# Patient Record
Sex: Male | Born: 1975
Health system: Southern US, Community
[De-identification: ages and names within clinical notes are randomized; demographics above are authoritative.]

## PROBLEM LIST (undated history)

## (undated) DIAGNOSIS — K3184 Gastroparesis: Secondary | ICD-10-CM

## (undated) DIAGNOSIS — R945 Abnormal results of liver function studies: Secondary | ICD-10-CM

## (undated) DIAGNOSIS — E119 Type 2 diabetes mellitus without complications: Secondary | ICD-10-CM

## (undated) DIAGNOSIS — I10 Essential (primary) hypertension: Secondary | ICD-10-CM

## (undated) DIAGNOSIS — K2970 Gastritis, unspecified, without bleeding: Secondary | ICD-10-CM

## (undated) DIAGNOSIS — S43131A Dislocation of right acromioclavicular joint, greater than 200% displacement, initial encounter: Secondary | ICD-10-CM

## (undated) DIAGNOSIS — G039 Meningitis, unspecified: Secondary | ICD-10-CM

## (undated) DIAGNOSIS — R3589 Other polyuria: Secondary | ICD-10-CM

## (undated) DIAGNOSIS — R7989 Other specified abnormal findings of blood chemistry: Secondary | ICD-10-CM

## (undated) DIAGNOSIS — K219 Gastro-esophageal reflux disease without esophagitis: Secondary | ICD-10-CM

## (undated) DIAGNOSIS — R358 Other polyuria: Secondary | ICD-10-CM

## (undated) DIAGNOSIS — K2940 Chronic atrophic gastritis without bleeding: Secondary | ICD-10-CM

## (undated) HISTORY — DX: Chronic atrophic gastritis without bleeding: K29.40

## (undated) HISTORY — DX: Gastro-esophageal reflux disease without esophagitis: K21.9

## (undated) HISTORY — DX: Abnormal results of liver function studies: R94.5

## (undated) HISTORY — PX: WISDOM TOOTH EXTRACTION: SHX21

## (undated) HISTORY — DX: Gastritis, unspecified, without bleeding: K29.70

## (undated) HISTORY — DX: Gastroparesis: K31.84

## (undated) HISTORY — DX: Other polyuria: R35.89

## (undated) HISTORY — PX: HERNIA REPAIR: SHX51

## (undated) HISTORY — DX: Meningitis, unspecified: G03.9

## (undated) HISTORY — DX: Other specified abnormal findings of blood chemistry: R79.89

## (undated) HISTORY — DX: Other polyuria: R35.8

---

## 1997-12-28 ENCOUNTER — Emergency Department (HOSPITAL_COMMUNITY): Admission: EM | Admit: 1997-12-28 | Discharge: 1997-12-28 | Payer: Self-pay | Admitting: *Deleted

## 2001-07-18 ENCOUNTER — Encounter: Payer: Self-pay | Admitting: Emergency Medicine

## 2001-07-18 ENCOUNTER — Emergency Department (HOSPITAL_COMMUNITY): Admission: EM | Admit: 2001-07-18 | Discharge: 2001-07-18 | Payer: Self-pay | Admitting: Emergency Medicine

## 2002-02-26 ENCOUNTER — Emergency Department (HOSPITAL_COMMUNITY): Admission: EM | Admit: 2002-02-26 | Discharge: 2002-02-27 | Payer: Self-pay | Admitting: Emergency Medicine

## 2007-11-27 ENCOUNTER — Ambulatory Visit: Payer: Self-pay | Admitting: Pulmonary Disease

## 2007-12-02 ENCOUNTER — Emergency Department (HOSPITAL_COMMUNITY): Admission: EM | Admit: 2007-12-02 | Discharge: 2007-12-02 | Payer: Self-pay | Admitting: Emergency Medicine

## 2007-12-14 ENCOUNTER — Encounter: Payer: Self-pay | Admitting: Pulmonary Disease

## 2008-10-30 ENCOUNTER — Telehealth: Payer: Self-pay | Admitting: Gastroenterology

## 2008-11-04 ENCOUNTER — Ambulatory Visit: Payer: Self-pay | Admitting: Gastroenterology

## 2008-11-04 DIAGNOSIS — R112 Nausea with vomiting, unspecified: Secondary | ICD-10-CM

## 2008-11-04 DIAGNOSIS — Z87898 Personal history of other specified conditions: Secondary | ICD-10-CM

## 2008-11-04 DIAGNOSIS — G039 Meningitis, unspecified: Secondary | ICD-10-CM | POA: Insufficient documentation

## 2008-11-05 ENCOUNTER — Ambulatory Visit: Payer: Self-pay | Admitting: Gastroenterology

## 2008-11-05 ENCOUNTER — Encounter: Payer: Self-pay | Admitting: Gastroenterology

## 2008-11-05 DIAGNOSIS — K2940 Chronic atrophic gastritis without bleeding: Secondary | ICD-10-CM | POA: Insufficient documentation

## 2008-11-05 LAB — CONVERTED CEMR LAB
ALT: 58 units/L — ABNORMAL HIGH (ref 0–53)
AST: 34 units/L (ref 0–37)
Albumin: 4.5 g/dL (ref 3.5–5.2)
Alkaline Phosphatase: 65 units/L (ref 39–117)
BUN: 11 mg/dL (ref 6–23)
Basophils Absolute: 0 10*3/uL (ref 0.0–0.1)
Basophils Relative: 0.5 % (ref 0.0–3.0)
Bilirubin Urine: NEGATIVE
Bilirubin, Direct: 0.1 mg/dL (ref 0.0–0.3)
CO2: 32 meq/L (ref 19–32)
Calcium: 9.8 mg/dL (ref 8.4–10.5)
Chloride: 105 meq/L (ref 96–112)
Creatinine, Ser: 0.9 mg/dL (ref 0.4–1.5)
Eosinophils Absolute: 0.1 10*3/uL (ref 0.0–0.7)
Eosinophils Relative: 1.6 % (ref 0.0–5.0)
Ferritin: 165.7 ng/mL (ref 22.0–322.0)
Folate: 12.4 ng/mL
GFR calc non Af Amer: 103.62 mL/min (ref >60)
Glucose, Bld: 84 mg/dL (ref 70–99)
HCT: 47.2 % (ref 39.0–52.0)
Hemoglobin, Urine: NEGATIVE
Hemoglobin: 16.8 g/dL (ref 13.0–17.0)
Iron: 144 ug/dL (ref 42–165)
Ketones, ur: NEGATIVE mg/dL
Leukocytes, UA: NEGATIVE
Lymphocytes Relative: 26.9 % (ref 12.0–46.0)
Lymphs Abs: 1.6 10*3/uL (ref 0.7–4.0)
MCHC: 35.5 g/dL (ref 30.0–36.0)
MCV: 90.2 fL (ref 78.0–100.0)
Monocytes Absolute: 0.4 10*3/uL (ref 0.1–1.0)
Monocytes Relative: 6.4 % (ref 3.0–12.0)
Neutro Abs: 3.7 10*3/uL (ref 1.4–7.7)
Neutrophils Relative %: 64.6 % (ref 43.0–77.0)
Nitrite: NEGATIVE
Platelets: 208 10*3/uL (ref 150.0–400.0)
Potassium: 4.3 meq/L (ref 3.5–5.1)
RBC: 5.23 M/uL (ref 4.22–5.81)
RDW: 11.3 % — ABNORMAL LOW (ref 11.5–14.6)
Rheumatoid fact SerPl-aCnc: 20.6 intl units/mL — ABNORMAL HIGH (ref 0.0–20.0)
Saturation Ratios: 40.1 % (ref 20.0–50.0)
Sodium: 143 meq/L (ref 135–145)
Specific Gravity, Urine: 1.015 (ref 1.000–1.030)
TSH: 0.95 microintl units/mL (ref 0.35–5.50)
Total Bilirubin: 0.9 mg/dL (ref 0.3–1.2)
Total Protein, Urine: NEGATIVE mg/dL
Total Protein: 7.6 g/dL (ref 6.0–8.3)
Transferrin: 256.7 mg/dL (ref 212.0–360.0)
UREASE: NEGATIVE
Urine Glucose: NEGATIVE mg/dL
Urobilinogen, UA: 0.2 (ref 0.0–1.0)
Vitamin B-12: 257 pg/mL (ref 211–911)
WBC: 5.8 10*3/uL (ref 4.5–10.5)
pH: 7.5 (ref 5.0–8.0)

## 2008-11-06 ENCOUNTER — Encounter: Payer: Self-pay | Admitting: Gastroenterology

## 2008-11-06 DIAGNOSIS — R109 Unspecified abdominal pain: Secondary | ICD-10-CM | POA: Insufficient documentation

## 2008-11-06 LAB — CONVERTED CEMR LAB
HCV Ab: NEGATIVE
Hepatitis B Surface Ag: NEGATIVE

## 2008-11-07 ENCOUNTER — Encounter: Payer: Self-pay | Admitting: Gastroenterology

## 2008-11-11 ENCOUNTER — Ambulatory Visit (HOSPITAL_COMMUNITY): Admission: RE | Admit: 2008-11-11 | Discharge: 2008-11-11 | Payer: Self-pay | Admitting: Gastroenterology

## 2011-03-01 ENCOUNTER — Other Ambulatory Visit: Payer: 59

## 2011-03-01 ENCOUNTER — Encounter: Payer: Self-pay | Admitting: Gastroenterology

## 2011-03-01 ENCOUNTER — Ambulatory Visit (INDEPENDENT_AMBULATORY_CARE_PROVIDER_SITE_OTHER): Payer: 59 | Admitting: Gastroenterology

## 2011-03-01 DIAGNOSIS — K219 Gastro-esophageal reflux disease without esophagitis: Secondary | ICD-10-CM | POA: Insufficient documentation

## 2011-03-01 DIAGNOSIS — R112 Nausea with vomiting, unspecified: Secondary | ICD-10-CM

## 2011-03-01 DIAGNOSIS — K3184 Gastroparesis: Secondary | ICD-10-CM | POA: Insufficient documentation

## 2011-03-01 MED ORDER — METOCLOPRAMIDE HCL 10 MG PO TABS: 10.0000 mg | ORAL_TABLET | Freq: Every day | ORAL | Status: DC

## 2011-03-01 MED ORDER — DEXLANSOPRAZOLE 60 MG PO CPDR: 60.0000 mg | DELAYED_RELEASE_CAPSULE | Freq: Every day | ORAL | Status: DC

## 2011-03-01 MED ORDER — DEXLANSOPRAZOLE 60 MG PO CPDR: 60.0000 mg | DELAYED_RELEASE_CAPSULE | Freq: Every day | ORAL | Status: AC

## 2011-03-01 MED ORDER — METOCLOPRAMIDE HCL 10 MG PO TABS: 10.0000 mg | ORAL_TABLET | Freq: Every day | ORAL | Status: AC

## 2011-03-01 NOTE — Patient Instructions (Signed)
Please go to the basement today for your labs.  We have sent to your pharmacy, Reglan 10mg  take one tablet by mouth at bedtime and Dexilant 60mg  take one by mouth 30 min before breakfast.

## 2011-03-01 NOTE — Progress Notes (Signed)
Addended by: Harlow Mares D on: 03/01/2011 04:10 PM   Modules accepted: Orders

## 2011-03-01 NOTE — Progress Notes (Signed)
This is a 35 year old Caucasian male with recurrent nausea and vomiting mostly of bilious material, especially in the morning. He has early satiety, abdominal gas and bloating vague abdominal pain without irregular bowel habits, melena or hematochezia or any specific hepatobiliary complaints. Previous endoscopy showed mild gastritis but otherwise was normal with a negative biopsy for H. pylori. Upper abdominal ultrasound exam also was normal. He uses when necessary Advil but denies abuse of NSAIDs, cigarettes, or alcohol. Family history is noncontributory. He does have some lactose intolerance but otherwise has had no intolerance to any specific food groups. There is no history of fever, chills, skin rash, joint pains et Karie Soda. Because of his tattoos we previously checked hepatitis serologies which were all made up. There is no known previous history of hepatitis or pancreatitis.  Current Medications, Allergies, Past Medical History, Past Surgical History, Family History and Social History were reviewed in [employer] record.  Pertinent Review of Systems Negative   Physical Exam: Somewhat agitated patient in no acute distress. I cannot appreciate stigmata of chronic liver disease. His chest is clear, and he appears to be in a regular rhythm without significant murmurs gallops or rubs. I cannot appreciate hepatosplenomegaly, abdominal masses or tenderness. Bowel sounds are normal. There is a succussion splash noted. Peripheral extremities unremarkable mental status is normal. Patient does have multiple tattoos that are visible. He seems somewhat frustrated and angry about his problems appear    Assessment and Plan: Probable gastroparesis with secondary acid reflux problems. I have elected to put him on Reglan 10 mg at bed and Dexilant 60 mg every morning. He is to call in 2 weeks time for progress report. He may need gastric emptying scan, small bowel series, and further workup. I  did order celiac serologies today. Encounter Diagnosis  Name Primary?  . Nausea and vomiting Yes

## 2011-03-02 ENCOUNTER — Telehealth: Payer: Self-pay | Admitting: *Deleted

## 2011-03-02 LAB — CELIAC PANEL 10
Endomysial Screen: NEGATIVE
Gliadin IgG: 14.1 U/mL (ref <20)
IgA: 262 mg/dL (ref 68–379)
Tissue Transglutaminase Ab, IgA: 8.7 U/mL (ref <20)

## 2011-03-02 NOTE — Telephone Encounter (Signed)
Message copied by Florene Glen on Wed Mar 02, 2011  2:05 PM ------      Message from: Jarold Motto, DAVID R      Created: Wed Mar 02, 2011  1:53 PM       He needs to use lactaid tabs also with dairy products.Marland Kitchen

## 2011-03-02 NOTE — Telephone Encounter (Signed)
Notified pt per Dr Jarold Motto, he needs to use Lactaid tablets when he eats dairy products; pt stated understanding.

## 2011-08-18 ENCOUNTER — Encounter: Payer: Self-pay | Admitting: Adult Health

## 2011-08-19 ENCOUNTER — Encounter: Payer: Self-pay | Admitting: Adult Health

## 2011-08-19 ENCOUNTER — Ambulatory Visit (INDEPENDENT_AMBULATORY_CARE_PROVIDER_SITE_OTHER): Payer: 59 | Admitting: Adult Health

## 2011-08-19 ENCOUNTER — Ambulatory Visit (INDEPENDENT_AMBULATORY_CARE_PROVIDER_SITE_OTHER)
Admission: RE | Admit: 2011-08-19 | Discharge: 2011-08-19 | Disposition: A | Discharge status: Home / Self Care | Payer: 59 | Source: Ambulatory Visit | Attending: Adult Health | Admitting: Adult Health

## 2011-08-19 VITALS — BP 124/94 | HR 91 | Temp 97.7°F | Ht 67.0 in | Wt 201.6 lb

## 2011-08-19 DIAGNOSIS — J019 Acute sinusitis, unspecified: Secondary | ICD-10-CM

## 2011-08-19 DIAGNOSIS — R05 Cough: Secondary | ICD-10-CM

## 2011-08-19 DIAGNOSIS — R059 Cough, unspecified: Secondary | ICD-10-CM

## 2011-08-19 MED ORDER — CEFDINIR 300 MG PO CAPS: 300.0000 mg | ORAL_CAPSULE | Freq: Two times a day (BID) | ORAL | Status: AC

## 2011-08-19 MED ORDER — PHENYLEPH-PROMETHAZINE-COD 5-6.25-10 MG/5ML PO SYRP: 5.0000 mL | ORAL_SOLUTION | ORAL | Status: DC | PRN

## 2011-08-19 NOTE — Progress Notes (Signed)
Addended by: Boone Master E on: 08/19/2011 11:53 AM   Modules accepted: Orders

## 2011-08-19 NOTE — Patient Instructions (Signed)
Omnicef 300mg  Twice daily  For 10 days -take with food.  Mucinex DM Twice daily  As needed  Cough/congestion.  Fluids and rest  Phenergan with codeine cough syrup 1-2 tsp every 4hr As needed  Cough. -may make you sleepy.  Stop Smoking.  follow up Dr. Kriste Basque  In 1 month for physical .  Please contact office for sooner follow up if symptoms do not improve or worsen or seek emergency care

## 2011-08-19 NOTE — Assessment & Plan Note (Signed)
Acute sinsusitis and bronchitis   Plan  Omnicef 300mg  Twice daily  For 10 days -take with food.  Mucinex DM Twice daily  As needed  Cough/congestion.  Fluids and rest  Phenergan with codeine cough syrup 1-2 tsp every 4hr As needed  Cough. -may make you sleepy.  Stop Smoking.  follow up Dr. Kriste Basque  In 1 month for physical .  Please contact office for sooner follow up if symptoms do not improve or worsen or seek emergency care

## 2011-08-19 NOTE — Progress Notes (Signed)
  Subjective:    Patient ID: Christopher Mercado, male    DOB: 1976-06-30, 36 y.o.   MRN: [id number]  HPI 36 yo male   08/19/2011 Acute OV  Complains of 2 weeks of cough, congestion , runny nose. Using a lot of cold meds without much relief.  Pain with coughing.   Complains of headache for 2 days, nasal congestion and drainage. +photosensitivity, +nausea NO visual changes. No arm weakness. No chest pain. Feels achy all over . Pain in legs and hands for last several days. Taking tylenol without any help. No dizziness or syncope. No fever  CXR today with no acute process   Review of Systems Constitutional:   No  weight loss, night sweats,  Fevers, chills,  +fatigue, or  lassitude.  HEENT:   No   Difficulty swallowing,  Tooth/dental problems, or  Sore throat,                No sneezing, itching, ear ache,  +nasal congestion, post nasal drip,   CV:  No chest pain,  Orthopnea, PND, swelling in lower extremities, anasarca, dizziness, palpitations, syncope.   GI  No heartburn, indigestion, abdominal pain, nausea, vomiting, diarrhea, change in bowel habits, loss of appetite, bloody stools.   Resp:    No coughing up of blood.   Marland Kitchen  No chest wall deformity  Skin: no rash or lesions.  GU: no dysuria, change in color of urine, no urgency or frequency.  No flank pain, no hematuria   MS:  No joint pain or swelling.  No decreased range of motion.  No back pain.  Psych:  No change in mood or affect. No depression or anxiety.  No memory loss.         Objective:   Physical Exam GEN: A/Ox3; pleasant , NAD, well nourished   HEENT:  [place]/AT,  EACs-clear, TMs-wnl, NOSE-red swollen turbinates, max sinus tenderness THROAT-clear, no lesions, no postnasal drip or exudate noted.   NECK:  Supple w/ fair ROM; no JVD; normal carotid impulses w/o bruits; no thyromegaly or nodules palpated; + ant cervical  lymphadenopathy.  RESP  Coarse BS , w/o, wheezes/ rales/ or rhonchi.no accessory muscle use, no  dullness to percussion  CARD:  RRR, no m/r/g  , no peripheral edema, pulses intact, no cyanosis or clubbing.  GI:   Soft & nt; nml bowel sounds; no organomegaly or masses detected.  Musco: Warm bil, no deformities or joint swelling noted.  Leg and hip ROM nml, no deformity noted. No swelling noted. nml grips, equal strength bilaterally of UE and LE   Neuro: alert, no focal deficits noted.   CN 2-12 intact , neg SLR , nml gait PERRLA , EOMI , +photosensitivity.   Skin: Warm, no lesions or rashes         Assessment & Plan:

## 2011-08-24 ENCOUNTER — Telehealth: Payer: Self-pay | Admitting: Pulmonary Disease

## 2011-08-24 MED ORDER — PREDNISONE (PAK) 5 MG PO TABS: ORAL_TABLET | ORAL | Status: DC

## 2011-08-24 NOTE — Telephone Encounter (Signed)
I left detailed message on VM per libby okay to do. Rx has been sent to [facility] per libby. I advised to call back if she had any questions

## 2011-08-24 NOTE — Telephone Encounter (Signed)
I spoke with spouse and she states dry cough, chest congestion, nasal congestion, sinus pressure, chest hurts from cough. She states pt is not any better. Pt saw TP on 08/18/10 and was given omnicef BID x 10 days and he started this Friday as well as phenergan w/ codeine cough syrup QD. He was taking it QID but broke out in a bad rash. Spouse is requesting further recs from SN. Please advise thanks  No Known Allergies

## 2011-08-24 NOTE — Telephone Encounter (Signed)
Per SN---mucinex otc 600mg   2 po bid  With lots of fluids, nasal saline mist --spray each nostril every hour while awake, pred dosepak  5 mg    12 day pack,   thanks

## 2011-09-07 ENCOUNTER — Other Ambulatory Visit (INDEPENDENT_AMBULATORY_CARE_PROVIDER_SITE_OTHER): Payer: 59

## 2011-09-07 ENCOUNTER — Encounter: Payer: Self-pay | Admitting: Pulmonary Disease

## 2011-09-07 ENCOUNTER — Ambulatory Visit (INDEPENDENT_AMBULATORY_CARE_PROVIDER_SITE_OTHER): Payer: 59 | Admitting: Pulmonary Disease

## 2011-09-07 VITALS — BP 122/74 | HR 104 | Temp 97.1°F | Ht 67.0 in | Wt 206.0 lb

## 2011-09-07 DIAGNOSIS — G2581 Restless legs syndrome: Secondary | ICD-10-CM

## 2011-09-07 DIAGNOSIS — K219 Gastro-esophageal reflux disease without esophagitis: Secondary | ICD-10-CM

## 2011-09-07 DIAGNOSIS — E663 Overweight: Secondary | ICD-10-CM

## 2011-09-07 DIAGNOSIS — F419 Anxiety disorder, unspecified: Secondary | ICD-10-CM

## 2011-09-07 DIAGNOSIS — J069 Acute upper respiratory infection, unspecified: Secondary | ICD-10-CM

## 2011-09-07 DIAGNOSIS — F411 Generalized anxiety disorder: Secondary | ICD-10-CM

## 2011-09-07 DIAGNOSIS — R945 Abnormal results of liver function studies: Secondary | ICD-10-CM

## 2011-09-07 DIAGNOSIS — Z0000 Encounter for general adult medical examination without abnormal findings: Secondary | ICD-10-CM

## 2011-09-07 DIAGNOSIS — R7989 Other specified abnormal findings of blood chemistry: Secondary | ICD-10-CM

## 2011-09-07 LAB — CBC WITH DIFFERENTIAL/PLATELET
Basophils Absolute: 0 10*3/uL (ref 0.0–0.1)
Eosinophils Absolute: 0.1 10*3/uL (ref 0.0–0.7)
Eosinophils Relative: 0.7 % (ref 0.0–5.0)
MCV: 90.9 fl (ref 78.0–100.0)
Monocytes Absolute: 0.4 10*3/uL (ref 0.1–1.0)
Neutrophils Relative %: 68 % (ref 43.0–77.0)
Platelets: 216 10*3/uL (ref 150.0–400.0)
WBC: 7.7 10*3/uL (ref 4.5–10.5)

## 2011-09-07 LAB — HEPATIC FUNCTION PANEL
ALT: 94 U/L — ABNORMAL HIGH (ref 0–53)
Alkaline Phosphatase: 62 U/L (ref 39–117)
Bilirubin, Direct: 0.1 mg/dL (ref 0.0–0.3)
Total Bilirubin: 0.4 mg/dL (ref 0.3–1.2)
Total Protein: 7.6 g/dL (ref 6.0–8.3)

## 2011-09-07 LAB — BASIC METABOLIC PANEL
CO2: 30 mEq/L (ref 19–32)
Calcium: 9.8 mg/dL (ref 8.4–10.5)
GFR: 103.2 mL/min (ref >60.00)
Sodium: 138 mEq/L (ref 135–145)

## 2011-09-07 LAB — TSH: TSH: 0.83 u[IU]/mL (ref 0.35–5.50)

## 2011-09-07 MED ORDER — ROPINIROLE HCL 0.5 MG PO TABS: ORAL_TABLET | ORAL | Status: DC

## 2011-09-07 NOTE — Patient Instructions (Signed)
Today we updated your med list in our EPIC system...    Continue your current medications the same...  Try Advil/ Aleve/ Tylenol for aches & pains (esp knee pain)... OK to use plain MUCINEX 1-2 tabs twice daily w/ fluids to help congestion... Avoid Pseudophed or Phenylephrine in OTC sinus & cold meds... OK to use the OTC sleep aides if they help> eg. TylenolPM, Benedryl, etc...  We are prescribing REQUIP 0.5mg  for your restless leg syndrome (RLS)...    Start w/ one pill about 30 min to 1 H prior to bedtime; and increase to 2 pills after about 2wks & stay on this dose...  Let's plan a follow up visit in about 6-8 wks & make an early AM appt & come FASTING that day so we can check your Cholesterol.Marland KitchenMarland Kitchen

## 2011-09-08 ENCOUNTER — Encounter: Payer: Self-pay | Admitting: Pulmonary Disease

## 2011-09-08 NOTE — Progress Notes (Signed)
Subjective:     Patient ID: Christopher Mercado, male   DOB: [birth year], 36 y.o.   MRN: [id number]  HPI 36 y/o M, son of Oneita Jolly who works in our office, here to re-establish care after a long hiatus...  ~  September 07, 2011:  Last seen by me in 21> old charts reviewed: paper chart, [facility] EMR, Epic EMR and Problem List established >>    He had a recent URI- sinusitis/ bronchitis & is slowly recovering (given Omnicef, Mucinex, fluids, & cough syrup); he is a smoker & advised to quit...   PROBLEM LIST:    Cigarette Smoker >> recently smoking <1/2ppd he says & "quit 1/13 cold [place]" ~  CXR 2/13 showed borderline heart size, clear lungs, NAD...  URI, Sinusitis, Bronchitis >> seen by TP 08/19/11 w/ head & chest congestion, drainage, cough, & exam showed sinus tenderness, inflamed nasal mucosa, coarse BS bilat, CXR was clear/ NAD;  Given Omnicef Bid, Mucinex, Fluids, Cough syrup, & rec to quit all smoking;  Only min better & called w/ persist symptoms so he was given Pred, but this made him sick, broke out in rash, felt bad, so he stopped it; only took the Omnicef 300mg /d rather than Bid;  ~  CXR 2/13 showed borderline heart size, clear lungs, NAD...  OVERWEIGHT >> we discussed diet & exercise needed to lose weight... ~  8/12  Weight = 199#,  67" tall,  BMI= 31 ~  2/13  Weight = 206#  GERD, ?Gastroparesis >> he had a GI eval DrPatterson 4/10 & 8/12 for symptoms of intermittent N&V, gas, bloating, & early satiety; he complains to me mostly about "tightness" in his abd, "sore, real tight", denies dysphagia, norm BMs;  They tried him on Dexilant (pt stopped- "it made me sick") and Reglan Qhs (he stopped this too after several wks)...  ~  Prev EGD 4/10 w/ mild gastritis, neg HPylori... ~  Prev Abd sonar 5/10 was neg- normal liver & GB ~  Labs including Celiac serologies were neg... NOTE: CBC- wnl, Fe- norm, B12= 257, ALT- sl elev at 58 (0-53)  ABNORMAL LFTs >>  ~  Review of paper chart  showed LFTs normal & Abd sonar normal in mid-90s (abd pain eval- nothing found). ~  Labs 4/10 showed SGOT= 34, SGPT= 58 (0-53)... DrPatterson did further eval due to tatoos & Hepatitis serology was neg. ~  Prev Abd sonar 5/10 was neg- normal liver & GB ~  Labs 2/13 showed SGOT= 44 (0-37), SGPT= 94 (0-53); says no Etoh but this is questioned; rec to avoid all hepatotoxins, lose wt 7 follow up w/ repeat labs before referring back to GI for further eval...  Hx Viral Meningitis >> presented w/ severe HA & was [facility] w/ abn CSF formula, treated conservatively & resolved...  RESTLESS LEG SYNDROME >> he notes leg discomfort but on careful questioning it is really an uncomfortable movement problem c/w RLS in legs from knees down & occurs every eve/ night for yrs and his wife is concerned- we discussed Rx w/ REQUIP 0.5mg  1==>2 Qhs...  ANXIETY & symptoms of catecholamine excess >>    Past Medical History  Diagnosis Date  . Meningitis, unspecified   . Polyuria   . 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 Hernia repair     Outpatient Encounter Prescriptions as of 09/07/2011  Medication Sig Dispense Refill  . cefdinir (OMNICEF) 300 MG capsule Take 300  mg by mouth daily.      Marland Kitchen rOPINIRole (REQUIP) 0.5 MG tablet Start w/ one pill about 30 min to 1 H prior to bedtime; and increase to 2 pills after about 2wks & stay on this dose.  60 tablet  3  . DISCONTD: ibuprofen (ADVIL,MOTRIN) 200 MG tablet Take 200 mg by mouth every 6 (six) hours as needed.        Marland Kitchen DISCONTD: Phenyleph-Promethazine-Cod 5-6.25-10 MG/5ML SYRP Take 5 mLs by mouth every 4 (four) hours as needed.  240 mL  0  . DISCONTD: predniSONE (STERAPRED UNI-PAK) 5 MG TABS 12 day pack- take as directed  42 tablet  0    Allergies  Allergen Reactions  . Prednisone Rash    Family History  Problem Relation Age of Onset  . Colon cancer      uncle  . Prostate cancer Father   . Diabetes  Mother   . Heart disease    . Liver disease      History   Social History  . Marital Status: Married    Spouse Name: Selena Batten    Number of Children: 2 - ages 42 & 38  . Years of Education: N/A   Occupational History  . 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 Smoking status: Former Smoker -- 0.3 packs/day    Types: Cigarettes    Quit date: 08/17/2011  . Smokeless tobacco: Never Used  . Alcohol Use: No - denies all Etoh exposure  . Drug Use: No  . Sexually Active: Not on file   Other Topics Concern  . Not on file   Social History Narrative  . No narrative on file     Current Medications, Allergies, Past Medical History, Past Surgical History, Family History, and Social History were reviewed in [employer] record.   Review of Systems    Constitutional:  Denies F/C/S, anorexia, unexpected weight change. HEENT:  No HA, visual changes, earache, sore throat, hoarseness; notes some nasal congestion. Resp:  No cough, sputum, hemoptysis; no SOB, tightness, wheezing. Cardio:  No CP, palpit, DOE, orthopnea, edema. GI:  Denies N/V/D/C or blood in stool; no reflux, abd pain, distention, or gas. GU:  No dysuria, freq, urgency, hematuria, or flank pain. MS:  Denies joint pain, swelling, tenderness, or decr ROM; no neck pain, back pain, etc. Neuro:  No tremors, seizures, dizziness, syncope, weakness, numbness, gait abn. Skin:  No suspicious lesions or skin rash. Heme:  No adenopathy, bruising, bleeding. Psyche: Denies confusion, sleep disturbance, hallucinations, anxiety, depression.   Objective:   Physical Exam    Vital Signs:  Reviewed...  General:  WD, WN, 36 y/o WM in NAD; alert & oriented; pleasant & cooperative... HEENT:  [place]/AT; Conjunctiva- pink, Sclera- nonicteric, EOM-wnl, PERRLA, Fundi-benign; EACs-clear, TMs-wnl; NOSE-clear; THROAT-clear & wnl. Neck:  Supple w/ full ROM; no JVD; normal  carotid impulses w/o bruits; no thyromegaly or nodules palpated; no lymphadenopathy. Chest:  Clear to P & A; without wheezes, rales, or rhonchi heard. Heart:  Regular Rhythm; norm S1 & S2 without murmurs, rubs, or gallops detected. Abdomen:  Soft & nontender- no guarding or rebound; active bowel sounds; no organomegaly or masses palpated. Ext:  Normal ROM; without deformities or arthritic changes; no varicose veins, venous insuffic, or edema;  Pulses intact w/o bruits. Neuro:  CNs II-XII intact; motor testing normal; sensory testing normal; gait normal & balance OK. Derm:  No lesions noted;  no rash, +tatoos Lymph:  No cervical, supraclavicular, axillary, or inguinal adenopathy palpated.  RADIOLOGY DATA:  Reviewed in the EPIC EMR & discussed w/ the patient...    >>CXR 2/13 showed borderline heart size, clear lungs, NAD...    >>EKG 2/13:  STachy, rate 102, poor R prog V1=2, otherw norm tracing...  LABORATORY DATA:  Reviewed in the EPIC EMR & discussed w/ the patient...    >>Nonfasting labs 2/13:  Normal x randomBS= 138, SGOT= 44, SGPT= 94   Assessment:     URI/ sinusitis, bronchitis>  He didn't take the Omnicef as directed (took 1/d x20d rather than Bid); he stopped the Pred on his own; gradually improving w/ time, rest, fluids, mucinex, tylenol, etc... No more meds at this point.  Overweight>  Advised weight reduction;  Needs to count calories, eat less;  Exercise & burn more...  Hx GERD>  He has mult somatic complaints, denies reflux, dysphagia, heartburn, etc; he knows that he may use Prilosec or Pepcid as needed...  Abn LFTs> ?etiology- he says no Etoh but this is brought into question, SGPT was sl elev in 2010 & elev even more now; no sigmata of chr liver dis & he had neg liver screen by DrPatterson in 2010; he's had several neg abd sonars in the past most recently 2010 (therefore doesn't look like FLD)... Rec> STOP ALL HEPATOTOXINS, get wt down, follow up in 69mo w/ repeat labs & refer to  GI if LFTs don't ret to normal...  RLS>  Hx is suggestive RLS & we decided on therapeutic trial of REQUIP 0.5mg  Qhs==> incr to 1mg  & assess response...  ?Anxiety, ?ADD, ?other>  Not on additional meds...      Plan:     Patient's Medications  New Prescriptions   ROPINIROLE (REQUIP) 0.5 MG TABLET    Start w/ one pill about 30 min to 1 H prior to bedtime; and increase to 2 pills after about 2wks & stay on this dose.  Previous Medications   CEFDINIR (OMNICEF) 300 MG CAPSULE    Take 300 mg by mouth daily.  Modified Medications   No medications on file  Discontinued Medications   IBUPROFEN (ADVIL,MOTRIN) 200 MG TABLET    Take 200 mg by mouth every 6 (six) hours as needed.     PHENYLEPH-PROMETHAZINE-COD 5-6.25-10 MG/5ML SYRP    Take 5 mLs by mouth every 4 (four) hours as needed.   PREDNISONE (STERAPRED UNI-PAK) 5 MG TABS    12 day pack- take as directed

## 2011-09-09 DIAGNOSIS — F419 Anxiety disorder, unspecified: Secondary | ICD-10-CM | POA: Insufficient documentation

## 2011-09-09 DIAGNOSIS — R945 Abnormal results of liver function studies: Secondary | ICD-10-CM | POA: Insufficient documentation

## 2011-09-09 DIAGNOSIS — E663 Overweight: Secondary | ICD-10-CM | POA: Insufficient documentation

## 2011-09-09 DIAGNOSIS — G2581 Restless legs syndrome: Secondary | ICD-10-CM | POA: Insufficient documentation

## 2011-10-10 ENCOUNTER — Other Ambulatory Visit (INDEPENDENT_AMBULATORY_CARE_PROVIDER_SITE_OTHER): Payer: 59

## 2011-10-10 ENCOUNTER — Other Ambulatory Visit: Payer: Self-pay | Admitting: Pulmonary Disease

## 2011-10-10 DIAGNOSIS — R945 Abnormal results of liver function studies: Secondary | ICD-10-CM

## 2011-10-10 DIAGNOSIS — R7989 Other specified abnormal findings of blood chemistry: Secondary | ICD-10-CM

## 2011-10-10 LAB — HEPATIC FUNCTION PANEL
ALT: 67 U/L — ABNORMAL HIGH (ref 0–53)
AST: 34 U/L (ref 0–37)
Alkaline Phosphatase: 58 U/L (ref 39–117)
Bilirubin, Direct: 0.1 mg/dL (ref 0.0–0.3)
Total Bilirubin: 0.1 mg/dL — ABNORMAL LOW (ref 0.3–1.2)

## 2011-10-10 LAB — BASIC METABOLIC PANEL
Chloride: 105 mEq/L (ref 96–112)
Potassium: 4.4 mEq/L (ref 3.5–5.1)
Sodium: 140 mEq/L (ref 135–145)

## 2011-10-12 ENCOUNTER — Other Ambulatory Visit: Payer: Self-pay | Admitting: Pulmonary Disease

## 2011-10-12 DIAGNOSIS — R945 Abnormal results of liver function studies: Secondary | ICD-10-CM

## 2011-11-01 ENCOUNTER — Encounter: Payer: Self-pay | Admitting: *Deleted

## 2011-11-02 ENCOUNTER — Emergency Department (INDEPENDENT_AMBULATORY_CARE_PROVIDER_SITE_OTHER)
Admission: EM | Admit: 2011-11-02 | Discharge: 2011-11-02 | Disposition: A | Discharge status: Home / Self Care | Payer: 59 | Source: Home / Self Care | Attending: Emergency Medicine | Admitting: Emergency Medicine

## 2011-11-02 ENCOUNTER — Encounter (HOSPITAL_COMMUNITY): Payer: Self-pay | Admitting: Emergency Medicine

## 2011-11-02 ENCOUNTER — Emergency Department (INDEPENDENT_AMBULATORY_CARE_PROVIDER_SITE_OTHER): Payer: 59

## 2011-11-02 ENCOUNTER — Other Ambulatory Visit (INDEPENDENT_AMBULATORY_CARE_PROVIDER_SITE_OTHER): Payer: 59

## 2011-11-02 ENCOUNTER — Ambulatory Visit (INDEPENDENT_AMBULATORY_CARE_PROVIDER_SITE_OTHER): Payer: 59 | Admitting: Gastroenterology

## 2011-11-02 ENCOUNTER — Encounter: Payer: Self-pay | Admitting: Gastroenterology

## 2011-11-02 VITALS — BP 122/70 | HR 80 | Ht 67.0 in | Wt 206.5 lb

## 2011-11-02 DIAGNOSIS — R7989 Other specified abnormal findings of blood chemistry: Secondary | ICD-10-CM

## 2011-11-02 DIAGNOSIS — M6580 Other synovitis and tenosynovitis, unspecified site: Secondary | ICD-10-CM

## 2011-11-02 DIAGNOSIS — R21 Rash and other nonspecific skin eruption: Secondary | ICD-10-CM

## 2011-11-02 DIAGNOSIS — M76899 Other specified enthesopathies of unspecified lower limb, excluding foot: Secondary | ICD-10-CM

## 2011-11-02 MED ORDER — DICLOFENAC SODIUM 75 MG PO TBEC: 75.0000 mg | DELAYED_RELEASE_TABLET | Freq: Two times a day (BID) | ORAL | Status: DC

## 2011-11-02 MED ORDER — ESOMEPRAZOLE MAGNESIUM 40 MG PO CPDR: 40.0000 mg | DELAYED_RELEASE_CAPSULE | Freq: Every day | ORAL | Status: DC

## 2011-11-02 NOTE — Patient Instructions (Addendum)
You have been given a separate informational sheet regarding your tobacco use, the importance of quitting and local resources to help you quit. Please go to the basement today for your labs.  Take Nexium once a day while taking Voltaren, samples given for 30 days.

## 2011-11-02 NOTE — Progress Notes (Addendum)
This is a 36 year old Caucasian male with mild elevation in his serum transaminases of unexplained etiology. He is asymptomatic in terms of any hepatobiliary complaints, abdominal pain, any history of prior hepatitis, or family history of liver disease. He in the past has had multiple tattoos, but denies intravenous drug use. He complains of mild fatigue but otherwise is asymptomatic without systemic complaints, pruritis, or mental status changes. He has not had previous hepatitis evaluation. He's been overweight for several years. Previous upper abdominal ultrasound exam 2 years ago was unremarkable. He denies heavy alcohol use. Currently he is starting both care and 75 mg twice a day for what sounds like left knee ligament damage.  Current Medications, Allergies, Past Medical History, Past Surgical History, Family History and Social History were reviewed in [employer] record.  Pertinent Review of Systems Negative   Physical Exam: Blood pressure 122/70, pulse 80 and regular, weight 206 pounds, BMI 32.34. He has multiple tattoos over his body. I cannot appreciate stigmata of chronic liver disease. There is no organomegaly, bowel masses or tenderness. Mental status is normal. Peripheral extremities are unremarkable. There is a fine papular rash over his entire trunk area without pustules, excoriation wetness, or induration.    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 Labs have been ordered for review. I have placed him on daily Nexium 40 mg while he takes NSAIDs. We will follow him with Dr.Nadel,, and in the future we'll attempt dietary caloric management. His skin rash suggest possible vitamin A deficiency, and I have placed him on Centrum silver daily. Encounter Diagnosis  Name Primary?  . Elevated LFTs Yes

## 2011-11-02 NOTE — ED Notes (Signed)
Pt here with sudden left top of knee pain that started yesterday.denies injury or strain.c/o throb pain that worsens with bending.min swelling and redness seen.no treatments tried for sx.

## 2011-11-02 NOTE — Discharge Instructions (Signed)
Tendinitis  Tendinitis is swelling and inflammation of the tendons. Tendons are band-like tissues that connect muscle to bone. Tendinitis commonly occurs in the:    Shoulders (rotator cuff).   Heels (Achilles tendon).   Elbows (triceps tendon).  CAUSES  Tendinitis is usually caused by overusing the tendon, muscles, and joints involved. When the tissue surrounding a tendon (synovium) becomes inflamed, it is called tenosynovitis. Tendinitis commonly develops in people whose jobs require repetitive motions.  SYMPTOMS   Pain.   Tenderness.   Mild swelling.  DIAGNOSIS  Tendinitis is usually diagnosed by physical exam. Your caregiver may also order X-rays or other imaging tests.  TREATMENT  Your caregiver may recommend certain medicines or exercises for your treatment.  HOME CARE INSTRUCTIONS    Use a sling or splint for as long as directed by your caregiver until the pain decreases.   Put ice on the injured area.   Put ice in a plastic bag.   Place a towel between your skin and the bag.   Leave the ice on for 15 to 20 minutes, 3 to 4 times a day.   Avoid using the limb while the tendon is painful. Perform gentle range of motion exercises only as directed by your caregiver. Stop exercises if pain or discomfort increase, unless directed otherwise by your caregiver.   Only take over-the-counter or prescription medicines for pain, discomfort, or fever as directed by your caregiver.  SEEK MEDICAL CARE IF:    Your pain and swelling increase.   You develop new, unexplained symptoms, especially increased numbness in the hands.  MAKE SURE YOU:    Understand these instructions.   Will watch your condition.   Will get help right away if you are not doing well or get worse.  Document Released: 06/24/2000 Document Revised: 06/16/2011 Document Reviewed: 09/13/2010  [employer] Patient Information 2012 [employer]

## 2011-11-02 NOTE — ED Provider Notes (Signed)
Chief Complaint  Patient presents with  . Knee Pain    History of Present Illness:   The patient is a 36 year old male who has had a two-day history of left knee pain. He localizes this to the quadriceps tendon medially. It's a very well localized area of pain and tenderness to palpation. Is a little bit swollen, puffy, red, and very tender to touch. He denies any injury. He states he just woke up with a morning yesterday. He denies any prior knee problems, arthritis, gout, fever, or chills. It hurts to bend the knee and feels better if he keeps it straight. 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 He states he rarely does any strenuous physical activity.  Review of Systems:  Other than noted above, the patient denies any of the following symptoms: Systemic:  No fevers, chills, sweats, or aches.  No fatigue or tiredness. Musculoskeletal:  No joint pain, arthritis, bursitis, swelling, back pain, or neck pain. Neurological:  No muscular weakness, paresthesias, headache, or trouble with speech or coordination.  No dizziness.   PMFSH:  Past medical history, family history, social history, meds, and allergies were reviewed.  Physical Exam:   Vital signs:  BP 127/81  Pulse 84  Temp(Src) 97.8 F (36.6 C) (Oral)  Resp 16  SpO2 97% Gen:  Alert and oriented times 3.  In no distress. Musculoskeletal: He has localized tenderness to palpation just over the patella medially. There is a little bit of puffiness in this area, some redness and some heat. It is very tender to touch. He is unable to bend his knee at all. There is no fluid in the joint. There is no pain to palpation over the mediolateral joint line or over the patella itself or over the patellar tendon. Unable to perform McMurray's sign or Lachman's sign or anterior drawer sign. No pain on varus or valgus stress. Otherwise, all joints had a full a ROM with no swelling, bruising or deformity.  No edema,  pulses full. Extremities were warm and pink.  Capillary refill was brisk.  Skin:  Clear, warm and dry.  No rash. Neuro:  Alert and oriented times 3.  Muscle strength was normal.  Sensation was intact to light touch.   Radiology:  Dg Knee Complete 4 Views Left  11/02/2011  *RADIOLOGY REPORT*  Clinical Data: Injury.  Patellar pain.  LEFT KNEE - COMPLETE 4+ VIEW  Comparison: None.  Findings: No evidence of fracture, dislocation, effusion or degenerative change.  There is a small radiopaque foreign object in the soft tissues just lateral to the patellar tendon.  Chronicity is indeterminate.  IMPRESSION: No acute bony finding.  No joint effusion.  Sufficient of small radiopaque foreign object in the soft tissues lateral to the patellar tendon, chronicity unknown.  Original Report Authenticated By: Thomasenia Sales, M.D.   Course in Urgent Care Center:   A knee immobilizer was applied.  Assessment:  The encounter diagnosis was Quadriceps tendonitis.  Plan:   1.  The following meds were prescribed:   New Prescriptions   DICLOFENAC (VOLTAREN) 75 MG EC TABLET    Take 1 tablet (75 mg total) by mouth 2 (two) times daily.   2.  The patient was instructed in symptomatic care, including rest and activity, elevation, application of ice and compression.  Appropriate handouts were given. 3.  The patient was told to return if becoming worse in any way, if no better in 3 or  4 days, and given some red flag symptoms that would indicate earlier return.   4.  The patient was told to follow up here in 2 weeks if no improvement.   Reuben Likes, MD 11/02/11 [phone]

## 2011-11-03 LAB — ALPHA-1-ANTITRYPSIN: A-1 Antitrypsin, Ser: 136 mg/dL (ref 90–200)

## 2011-11-03 LAB — CERULOPLASMIN: Ceruloplasmin: 22 mg/dL (ref 20–60)

## 2011-11-11 ENCOUNTER — Ambulatory Visit: Payer: 59 | Admitting: Pulmonary Disease

## 2012-04-05 ENCOUNTER — Telehealth: Payer: Self-pay | Admitting: *Deleted

## 2012-04-05 NOTE — Telephone Encounter (Signed)
Message copied by Florene Glen on Thu Apr 05, 2012 10:51 AM ------      Message from: Florene Glen      Created: Thu Nov 03, 2011  3:40 PM       Needs repeat lft's in 6 months

## 2012-04-05 NOTE — Telephone Encounter (Signed)
lmom for pt to call back

## 2012-04-12 ENCOUNTER — Encounter (HOSPITAL_COMMUNITY): Payer: Self-pay | Admitting: Emergency Medicine

## 2012-04-12 ENCOUNTER — Emergency Department (HOSPITAL_COMMUNITY)
Admission: EM | Admit: 2012-04-12 | Discharge: 2012-04-12 | Disposition: A | Discharge status: Home / Self Care | Payer: 59 | Attending: Emergency Medicine | Admitting: Emergency Medicine

## 2012-04-12 DIAGNOSIS — Z888 Allergy status to other drugs, medicaments and biological substances status: Secondary | ICD-10-CM | POA: Insufficient documentation

## 2012-04-12 DIAGNOSIS — H16139 Photokeratitis, unspecified eye: Secondary | ICD-10-CM | POA: Insufficient documentation

## 2012-04-12 DIAGNOSIS — H16133 Photokeratitis, bilateral: Secondary | ICD-10-CM

## 2012-04-12 DIAGNOSIS — F17200 Nicotine dependence, unspecified, uncomplicated: Secondary | ICD-10-CM | POA: Insufficient documentation

## 2012-04-12 DIAGNOSIS — X58XXXA Exposure to other specified factors, initial encounter: Secondary | ICD-10-CM | POA: Insufficient documentation

## 2012-04-12 MED ORDER — FLUORESCEIN SODIUM 1 MG OP STRP
1.0000 | ORAL_STRIP | Freq: Once | OPHTHALMIC | Status: AC
  Administered 2012-04-12: 1 via OPHTHALMIC
  Filled 2012-04-12: qty 2

## 2012-04-12 MED ORDER — OXYCODONE-ACETAMINOPHEN 5-325 MG PO TABS
1.0000 | ORAL_TABLET | Freq: Once | ORAL | Status: DC
  Filled 2012-04-12: qty 1

## 2012-04-12 MED ORDER — IBUPROFEN 600 MG PO TABS: 600.0000 mg | ORAL_TABLET | Freq: Four times a day (QID) | ORAL | Status: DC | PRN

## 2012-04-12 MED ORDER — PROPARACAINE HCL 0.5 % OP SOLN
2.0000 [drp] | Freq: Once | OPHTHALMIC | Status: AC
  Administered 2012-04-12: 2 [drp] via OPHTHALMIC
  Filled 2012-04-12: qty 15

## 2012-04-12 MED ORDER — POLYMYXIN B-TRIMETHOPRIM 10000-0.1 UNIT/ML-% OP SOLN: 1.0000 [drp] | Freq: Four times a day (QID) | OPHTHALMIC | Status: AC

## 2012-04-12 NOTE — ED Provider Notes (Signed)
Medical screening examination/treatment/procedure(s) were performed by non-physician practitioner and as supervising physician I was immediately available for consultation/collaboration.    Vida Roller, MD 04/12/12 [phone]

## 2012-04-12 NOTE — ED Notes (Signed)
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

## 2012-04-12 NOTE — ED Notes (Signed)
Pt not answer PMH questions for me, pt states mark everything down, and when asked if he takes meds for what he is saying or if a doctor diagnosed him with it, pt saying no; unable to complete Medical history

## 2012-04-12 NOTE — ED Provider Notes (Signed)
History     CSN: [id number]  Arrival date & time 04/12/12  0006   First MD Initiated Contact with Patient 04/12/12 0029      Chief Complaint  Patient presents with  . 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  Onset is gradual, persistent, and worsening.  He also notice redness and burning sensation to face with mild itchiness.  He proceed to take 3 benadryls and also 3 xanax, without relief.  He also took a hot shower.  However, his sxs persist and currently he is complaining of eye pain and light sensitivity.  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  Pt is here because of eye sensitivity and flushing sensation to face.    Past Medical History  Diagnosis Date  . Meningitis, unspecified   . Polyuria   . 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 Gastritis   . Elevated LFTs   . Gastroparesis   . Esophageal reflux     Past Surgical History  Procedure Date  . Hernia repair     Family History  Problem Relation Age of Onset  . Colon cancer      uncle  . Prostate cancer Father   . Diabetes Mother   . Heart disease    . Liver disease      History  Substance Use Topics  . Smoking status: Current Every Day Smoker -- 0.3 packs/day    Types: Cigarettes    Last Attempt to Quit: 08/17/2011  . Smokeless tobacco: Never Used  . Alcohol Use: No      Review of Systems  All other systems reviewed and are negative.    Allergies  Prednisone  Home Medications   Current Outpatient Rx  Name Route Sig Dispense Refill  . ALPRAZOLAM 0.5 MG PO TABS Oral Take 1 mg by mouth at bedtime as needed. For sleep    . DIPHENHYDRAMINE HCL 25 MG PO TABS Oral Take 75 mg by mouth at bedtime as needed. For sleep    . OVER THE COUNTER MEDICATION Both Eyes Place 1 drop into both eyes  every 2 (two) hours as needed. For dry, itchy eyes      BP 138/96  Pulse 100  Temp 98.1 F (36.7 C) (Oral)  Resp 16  SpO2 96%  Physical Exam  Nursing note and vitals reviewed. Constitutional: He is oriented to person, place, and time. He appears well-developed and well-nourished.       Uncomfortable appearing, eyes closed.   HENT:  Head: Normocephalic and atraumatic.  Right Ear: External ear normal.  Left Ear: External ear normal.  Nose: Nose normal.  Mouth/Throat: Oropharynx is clear and moist. No oropharyngeal exudate.  Eyes: Lids are normal. Pupils are equal, round, and reactive to light. No foreign bodies found. Right eye exhibits no discharge. No foreign body present in the right eye. Left eye exhibits no discharge. No foreign body present in the left eye. Right conjunctiva is injected. Right conjunctiva has no hemorrhage. Left conjunctiva is injected. Left conjunctiva has no hemorrhage. Right eye exhibits normal extraocular motion. Left eye exhibits normal extraocular motion.  Slit lamp exam:      The right eye shows no corneal abrasion, no corneal flare, no corneal ulcer and no fluorescein uptake.       The left eye shows no corneal abrasion,  no corneal flare, no corneal ulcer and no fluorescein uptake.       pulpil 2mm bilat, constricted.  With light sensitivity.  Conjunctiva erythematous.  No fb seen.  EOMI.  Neck: Normal range of motion. Neck supple.  Pulmonary/Chest: Effort normal and breath sounds normal. No respiratory distress. He has no wheezes. He has no rales. He exhibits no tenderness.  Neurological: He is alert and oriented to person, place, and time.  Skin: Skin is warm.       Skin is warm and sunburnt which covers face/head neck, and upper arms.  Normal skin inside shirt distribution.    Psychiatric: He has a normal mood and affect.    ED Course  Procedures (including critical care time)  Labs Reviewed - No data to display No results found.   No diagnosis  found.  1. photokeratitis  MDM  Pt likely having photokeratitis from welding flash.  Pt has dramatic improvement of symptoms after administration of proparacaine.  He has light sensitivity afterward.  I recommend cool compress over eyes.  Eye patch for the next days.  Ibuprofen for pain.  Visine eyedrops to use as needed.  Polytrim abx prescribed.  Pt has his personal eye doctor that he will follow up.  Pt to avoid rubbing eyes.  Pt does not wear contact lenses.    BP 138/96  Pulse 100  Temp 98.1 F (36.7 C) (Oral)  Resp 16  SpO2 96%  Nursing notes reviewed and considered in documentation  Previous records reviewed and considered        Fayrene Helper, PA-C 04/12/12 0229  Fayrene Helper, PA-C 04/12/12 [phone]

## 2012-05-21 NOTE — Telephone Encounter (Signed)
Pt never called back and labs have not been drawn; mailed pt a letter to contact us.

## 2014-04-15 ENCOUNTER — Ambulatory Visit (INDEPENDENT_AMBULATORY_CARE_PROVIDER_SITE_OTHER): Payer: 59 | Admitting: Family

## 2014-04-15 ENCOUNTER — Encounter: Payer: Self-pay | Admitting: Family

## 2014-04-15 VITALS — BP 140/100 | HR 80 | Temp 97.8°F | Wt 207.0 lb

## 2014-04-15 DIAGNOSIS — F419 Anxiety disorder, unspecified: Secondary | ICD-10-CM

## 2014-04-15 MED ORDER — BUSPIRONE HCL 7.5 MG PO TABS: 7.5000 mg | ORAL_TABLET | Freq: Two times a day (BID) | ORAL | Status: DC

## 2014-04-15 NOTE — Progress Notes (Signed)
Pre visit review using our clinic review tool, if applicable. No additional management support is needed unless otherwise documented below in the visit note. 

## 2014-04-15 NOTE — Patient Instructions (Signed)
Thank you for choosing [facility].  Summary/Instructions:   Please start buspar as directed  Please follow up in a month  Please make an appointment for physical      Buspirone tablets What is this medicine? BUSPIRONE (byoo SPYE rone) is used to treat anxiety disorders. This medicine may be used for other purposes; ask your health care provider or pharmacist if you have questions. COMMON BRAND NAME(S): BuSpar What should I tell my health care provider before I take this medicine? They need to know if you have any of these conditions: -kidney or liver disease -an unusual or allergic reaction to buspirone, other medicines, foods, dyes, or preservatives -pregnant or trying to get pregnant -breast-feeding How should I use this medicine? Take this medicine by mouth with a glass of water. Follow the directions on the prescription label. You may take this medicine with or without food. To ensure that this medicine always works the same way for you, you should take it either always with or always without food. Take your doses at regular intervals. Do not take your medicine more often than directed. Do not stop taking except on the advice of your doctor or health care professional. Talk to your pediatrician regarding the use of this medicine in children. Special care may be needed. Overdosage: If you think you have taken too much of this medicine contact a poison control center or emergency room at once. NOTE: This medicine is only for you. Do not share this medicine with others. What if I miss a dose? If you miss a dose, take it as soon as you can. If it is almost time for your next dose, take only that dose. Do not take double or extra doses. What may interact with this medicine? Do not take this medicine with any of the following medications: -linezolid -MAOIs like Carbex, Eldepryl, Marplan, Nardil, and Parnate -methylene blue -procarbazine This medicine may also interact  with the following medications: -diazepam -digoxin -diltiazem -erythromycin -grapefruit juice -haloperidol -medicines for mental depression or mood problems -medicines for seizures like carbamazepine, phenobarbital and phenytoin -nefazodone -other medications for anxiety -rifampin -ritonavir -some antifungal medicines like itraconazole, ketoconazole, and voriconazole -verapamil -warfarin This list may not describe all possible interactions. Give your health care provider a list of all the medicines, herbs, non-prescription drugs, or dietary supplements you use. Also tell them if you smoke, drink alcohol, or use illegal drugs. Some items may interact with your medicine. What should I watch for while using this medicine? Visit your doctor or health care professional for regular checks on your progress. It may take 1 to 2 weeks before your anxiety gets better. You may get drowsy or dizzy. Do not drive, use machinery, or do anything that needs mental alertness until you know how this drug affects you. Do not stand or sit up quickly, especially if you are an older patient. This reduces the risk of dizzy or fainting spells. Alcohol can make you more drowsy and dizzy. Avoid alcoholic drinks. What side effects may I notice from receiving this medicine? Side effects that you should report to your doctor or health care professional as soon as possible: -blurred vision or other vision changes -chest pain -confusion -difficulty breathing -feelings of hostility or anger -muscle aches and pains -numbness or tingling in hands or feet -ringing in the ears -skin rash and itching -vomiting -weakness Side effects that usually do not require medical attention (report to your doctor or health care professional if they continue or  are bothersome): -disturbed dreams, nightmares -headache -nausea -restlessness or nervousness -sore throat and nasal congestion -stomach upset This list may not describe  all possible side effects. Call your doctor for medical advice about side effects. You may report side effects to FDA at 1-800-FDA-1088. Where should I keep my medicine? Keep out of the reach of children. Store at room temperature below 30 degrees C (86 degrees F). Protect from light. Keep container tightly closed. Throw away any unused medicine after the expiration date. NOTE: This sheet is a summary. It may not cover all possible information. If you have questions about this medicine, talk to your doctor, pharmacist, or health care provider.  2015, Elsevier/Gold Standard. (2010-02-04 18:06:11)

## 2014-04-15 NOTE — Progress Notes (Signed)
Subjective:    Patient ID: Christopher Mercado, male    DOB: 30-Nov-1975, 38 y.o.   MRN: [id number]  HPI:  Christopher Mercado is a 38 y.o. male who presents today to establish care and to discuss anxiety and sleeping.  1) Anxiety/Sleep - Describes the inability to sleep most nights of the week secondary to his mind racing which makes him anxious and repeats itself. This has been going on for an undisclosed period of time. The lack of ability to sleep is refractory to previous use of benedryl, melatonin, and ambian. Indicates he took one of his mother's little blue pills which he believes was Xanax which he was sound asleep. Insists on not having any medication that is addictive, but would like to have something to help him calm down.   2) Pt refused both influenza and Tetanus vaccination.   Allergies  Allergen Reactions  . Prednisone Rash    No current outpatient prescriptions on file prior to visit.   No current facility-administered medications on file prior to visit.    Past Medical History  Diagnosis Date  . Meningitis, unspecified(322.9)     24-37 years of age  . Polyuria   . 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 Gastritis   . Elevated LFTs   . Gastroparesis     Seen by GI once - has "lived with it"  . Esophageal reflux    Family History  Problem Relation Age of Onset  . Colon cancer      uncle  . Prostate cancer Father   . Cancer Father   . Heart attack Father   . Heart disease Father   . Hypertension Mother   . CAD Mother   . Heart disease Mother   . Heart disease    . Liver disease    . Hyperlipidemia Maternal Grandfather   . Hypertension Maternal Grandfather    History   Social History  . Marital Status: Married    Spouse Name: kimberly    Number of Children: 2  . Years of Education: 11.5   Occupational History  . landscaping    Social History Main Topics  . Smoking status: Former Smoker -- 0.30 packs/day for 16 years    Types: Cigarettes     Quit date: 08/17/2011  . Smokeless tobacco: Never Used  . Alcohol Use: No  . Drug Use: No  . Sexual Activity: Yes    Partners: Female   Other Topics Concern  . Not on file   Social History Narrative   Born and raised [place], [place]   Married for 20 years   2 children 18 and 16   Rides motorcycle for fun.   Denies spiritual/religious beliefs   Exercise: only at work   Diet: Regular, eats fruits and vegetables.     Review of Systems  Constitutional: Positive for fatigue. Negative for fever, chills and unexpected weight change.  HENT: Negative for congestion.   Respiratory: Negative for cough, chest tightness, shortness of breath and wheezing.   Cardiovascular: Negative for chest pain, palpitations and leg swelling.  Endocrine: Negative for cold intolerance, heat intolerance, polydipsia, polyphagia and polyuria.  Allergic/Immunologic: Negative for environmental allergies.  Neurological: Negative for dizziness and headaches.  Psychiatric/Behavioral: Positive for sleep disturbance. The patient is nervous/anxious.         Objective:     BP 140/100  Pulse 80  Temp(Src) 97.8 F (36.6 C) (Oral)  Wt 207 lb (93.895 kg)  SpO2 97% Nursing  note and vital signs reviewed.  Physical Exam  Constitutional: He is oriented to person, place, and time. He appears well-developed and well-nourished.  Appears anxious and restless  HENT:  Head: Normocephalic.  Cardiovascular: Normal rate, regular rhythm and normal heart sounds.   Pulmonary/Chest: Effort normal and breath sounds normal.  Neurological: He is alert and oriented to person, place, and time.  Skin: Skin is warm and dry.  Psychiatric: He has a normal mood and affect. His behavior is normal. Judgment and thought content normal.        Assessment & Plan:

## 2014-04-15 NOTE — Assessment & Plan Note (Addendum)
May be the cause of the insomnia the patient is experiencing. Spent >25 minutes discussing the pros and cons of various medications and the respective side effects as well as proper sleep hygiene. Plan to start BuSpar to determine effectiveness. Will follow up in about a month or sooner to inform of effectiveness. Included patient education regarding effects of BuSpar.

## 2014-04-23 ENCOUNTER — Telehealth: Payer: Self-pay | Admitting: *Deleted

## 2014-04-23 MED ORDER — ALPRAZOLAM 0.25 MG PO TABS: 0.2500 mg | ORAL_TABLET | Freq: Every day | ORAL | Status: DC

## 2014-04-23 NOTE — Telephone Encounter (Signed)
Left msg on triage husband is still having trouble sleeping. Henreitta Ceaequesting Greg, PA to rx something...Raechel Chute/lmb

## 2014-04-23 NOTE — Telephone Encounter (Signed)
Spoke with the pts wife Selena BattenKim and informed her that her husband xanax rx was here and ready to be picked up. She said she would swing by today when she got off work to get it.

## 2014-04-23 NOTE — Telephone Encounter (Signed)
New prescription written

## 2014-07-07 ENCOUNTER — Telehealth: Payer: Self-pay | Admitting: Family

## 2014-07-07 ENCOUNTER — Other Ambulatory Visit: Payer: Self-pay | Admitting: Geriatric Medicine

## 2014-07-07 MED ORDER — ALPRAZOLAM 0.25 MG PO TABS: 0.2500 mg | ORAL_TABLET | Freq: Every day | ORAL | Status: DC

## 2014-07-07 NOTE — Telephone Encounter (Signed)
Printed and waiting for signature. 

## 2014-07-07 NOTE — Telephone Encounter (Signed)
Pt wife called in and wanted to see if pt could get a refill on his Xanex?

## 2014-07-07 NOTE — Telephone Encounter (Signed)
Faxed to [facility].  

## 2014-08-29 ENCOUNTER — Ambulatory Visit (INDEPENDENT_AMBULATORY_CARE_PROVIDER_SITE_OTHER): Payer: 59 | Admitting: Family

## 2014-08-29 ENCOUNTER — Encounter: Payer: Self-pay | Admitting: Family

## 2014-08-29 VITALS — BP 142/100 | HR 66 | Temp 97.7°F | Resp 18 | Ht 64.0 in | Wt 206.1 lb

## 2014-08-29 DIAGNOSIS — F419 Anxiety disorder, unspecified: Secondary | ICD-10-CM

## 2014-08-29 DIAGNOSIS — I10 Essential (primary) hypertension: Secondary | ICD-10-CM

## 2014-08-29 MED ORDER — ALPRAZOLAM 0.25 MG PO TABS: 0.2500 mg | ORAL_TABLET | Freq: Every evening | ORAL | Status: DC | PRN

## 2014-08-29 MED ORDER — AMLODIPINE BESYLATE 5 MG PO TABS: 5.0000 mg | ORAL_TABLET | Freq: Every day | ORAL | Status: DC

## 2014-08-29 NOTE — Progress Notes (Signed)
Pre visit review using our clinic review tool, if applicable. No additional management support is needed unless otherwise documented below in the visit note. 

## 2014-08-29 NOTE — Progress Notes (Signed)
   Subjective:    Patient ID: Christopher Mercado, male    DOB: 10/10/1975, 39 y.o.   MRN: [id number]  Chief Complaint  Patient presents with  . Follow-up    HPI:  Christopher Spikendy Dec is a 39 y.o. male who presents today    Anxiety and insomnia - indicates that the xanax that was previously started has helped him. States that he averages about 6 hours per sleep per night and takes the Xanax 1-2 times per week.    2) Blood pressure - notes that he has had several readings of high blood pressure recenty.   BP Readings from Last 3 Encounters:  08/29/14 142/100  04/15/14 140/100  04/12/12 138/96    Allergies  Allergen Reactions  . Prednisone Rash    No current outpatient prescriptions on file prior to visit.   No current facility-administered medications on file prior to visit.    Review of Systems  Eyes:       Negative for changes in vision.  Respiratory: Negative for chest tightness and shortness of breath.   Cardiovascular: Negative for chest pain, palpitations and leg swelling.      Objective:    BP 142/100 mmHg  Pulse 66  Temp(Src) 97.7 F (36.5 C) (Oral)  Resp 18  Ht 5\' 4"  (1.626 m)  Wt 206 lb 1.9 oz (93.495 kg)  BMI 35.36 kg/m2  SpO2 97% Nursing note and vital signs reviewed.  Physical Exam  Constitutional: He is oriented to person, place, and time. He appears well-developed and well-nourished. No distress.  Cardiovascular: Normal rate, regular rhythm, normal heart sounds and intact distal pulses.   Pulmonary/Chest: Effort normal and breath sounds normal.  Neurological: He is alert and oriented to person, place, and time.  Skin: Skin is warm and dry.  Psychiatric: He has a normal mood and affect. His behavior is normal. Judgment and thought content normal.       Assessment & Plan:

## 2014-08-29 NOTE — Assessment & Plan Note (Signed)
Stable with current dose of Xanax as needed. Indicates that his anxiety and sleep have improved. Continue current dosage of Xanax.

## 2014-08-29 NOTE — Assessment & Plan Note (Signed)
Patient has had several high blood pressure readings. Start amlodipine. Follow-up in approximately one month to determine effectiveness.

## 2014-08-29 NOTE — Patient Instructions (Signed)
Thank you for choosing [facility].  Summary/Instructions:  Your prescription(s) have been submitted to your pharmacy or been printed and provided for you. Please take as directed and contact our office if you believe you are having problem(s) with the medication(s) or have any questions.  If your symptoms worsen or fail to improve, please contact our office for further instruction, or in case of emergency go directly to the emergency room at the closest medical facility.   Hypertension Hypertension, commonly called high blood pressure, is when the force of blood pumping through your arteries is too strong. Your arteries are the blood vessels that carry blood from your heart throughout your body. A blood pressure reading consists of a higher number over a lower number, such as 110/72. The higher number (systolic) is the pressure inside your arteries when your heart pumps. The lower number (diastolic) is the pressure inside your arteries when your heart relaxes. Ideally you want your blood pressure below 120/80. Hypertension forces your heart to work harder to pump blood. Your arteries may become narrow or stiff. Having hypertension puts you at risk for heart disease, stroke, and other problems.  RISK FACTORS Some risk factors for high blood pressure are controllable. Others are not.  Risk factors you cannot control include:   Race. You may be at higher risk if you are African American.  Age. Risk increases with age.  Gender. Men are at higher risk than women before age 45 years. After age 65, women are at higher risk than men. Risk factors you can control include:  Not getting enough exercise or physical activity.  Being overweight.  Getting too much fat, sugar, calories, or salt in your diet.  Drinking too much alcohol. SIGNS AND SYMPTOMS Hypertension does not usually cause signs or symptoms. Extremely high blood pressure (hypertensive crisis) may cause headache, anxiety,  shortness of breath, and nosebleed. DIAGNOSIS  To check if you have hypertension, your health care provider will measure your blood pressure while you are seated, with your arm held at the level of your heart. It should be measured at least twice using the same arm. Certain conditions can cause a difference in blood pressure between your right and left arms. A blood pressure reading that is higher than normal on one occasion does not mean that you need treatment. If one blood pressure reading is high, ask your health care provider about having it checked again. TREATMENT  Treating high blood pressure includes making lifestyle changes and possibly taking medicine. Living a healthy lifestyle can help lower high blood pressure. You may need to change some of your habits. Lifestyle changes may include:  Following the DASH diet. This diet is high in fruits, vegetables, and whole grains. It is low in salt, red meat, and added sugars.  Getting at least 2 hours of brisk physical activity every week.  Losing weight if necessary.  Not smoking.  Limiting alcoholic beverages.  Learning ways to reduce stress. If lifestyle changes are not enough to get your blood pressure under control, your health care provider may prescribe medicine. You may need to take more than one. Work closely with your health care provider to understand the risks and benefits. HOME CARE INSTRUCTIONS  Have your blood pressure rechecked as directed by your health care provider.   Take medicines only as directed by your health care provider. Follow the directions carefully. Blood pressure medicines must be taken as prescribed. The medicine does not work as well when you skip doses.   Skipping doses also puts you at risk for problems.   Do not smoke.   Monitor your blood pressure at home as directed by your health care provider. SEEK MEDICAL CARE IF:   You think you are having a reaction to medicines taken.  You have  recurrent headaches or feel dizzy.  You have swelling in your ankles.  You have trouble with your vision. SEEK IMMEDIATE MEDICAL CARE IF:  You develop a severe headache or confusion.  You have unusual weakness, numbness, or feel faint.  You have severe chest or abdominal pain.  You vomit repeatedly.  You have trouble breathing. MAKE SURE YOU:   Understand these instructions.  Will watch your condition.  Will get help right away if you are not doing well or get worse. Document Released: 06/27/2005 Document Revised: 11/11/2013 Document Reviewed: 04/19/2013 Exit[employer] Patient Information 2015 [employer]. This information is not intended to replace advice given to you by your health care provider. Make sure you discuss any questions you have with your health care provider.   

## 2014-08-30 ENCOUNTER — Telehealth: Payer: Self-pay | Admitting: Family

## 2014-08-30 NOTE — Telephone Encounter (Signed)
emmi mailed  °

## 2015-04-10 ENCOUNTER — Encounter: Payer: Self-pay | Admitting: Family

## 2015-04-10 ENCOUNTER — Ambulatory Visit (INDEPENDENT_AMBULATORY_CARE_PROVIDER_SITE_OTHER): Payer: 59 | Admitting: Family

## 2015-04-10 VITALS — BP 110/80 | HR 95 | Temp 98.6°F | Resp 18 | Ht 64.0 in | Wt 199.0 lb

## 2015-04-10 DIAGNOSIS — I10 Essential (primary) hypertension: Secondary | ICD-10-CM | POA: Diagnosis not present

## 2015-04-10 DIAGNOSIS — F419 Anxiety disorder, unspecified: Secondary | ICD-10-CM | POA: Diagnosis not present

## 2015-04-10 MED ORDER — ALPRAZOLAM 0.25 MG PO TABS: 0.2500 mg | ORAL_TABLET | Freq: Two times a day (BID) | ORAL | Status: DC | PRN

## 2015-04-10 NOTE — Assessment & Plan Note (Signed)
Anxiety is well controlled with current regimen. Has been out of medication secondary to not obtaining a refill. Denies adverse reactions. Continue current dose of Xanax.

## 2015-04-10 NOTE — Assessment & Plan Note (Signed)
Blood pressure below goal of 140/90 and takes the medication sporadically. Has lost 7 pounds since last visit. Continue to monitor blood pressure at home as able. Continue current dose of amlodipine. Follow up in 6 months.

## 2015-04-10 NOTE — Progress Notes (Signed)
   Subjective:    Patient ID: Christopher Mercado, male    DOB: 23-Dec-1975, 39 y.o.   MRN: [id number]  Chief Complaint  Patient presents with  . medication follow up    needs refill of both medication     HPI:  Christopher Mercado is a 39 y.o. 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 and presents today for an office follow up.   1.) Hypertension - Currently maintained on amlodipine and takes the medication sporadically when he remembers. Denies adverse side effects of hypotensive events. Denies any symptoms of end-organ damage.   BP Readings from Last 3 Encounters:  04/10/15 110/80  08/29/14 142/100  04/15/14 140/100    2.) Anxiety - Currently maintained on Xanax. He has not taken the medication recently as he ran out of the medication and has not refilled it. When he takes the medication it does helps with his anxiety and occasional sleep. Denies adverse side effects.    Allergies  Allergen Reactions  . Prednisone Rash     Current Outpatient Prescriptions on File Prior to Visit  Medication Sig Dispense Refill  . amLODipine (NORVASC) 5 MG tablet Take 1 tablet (5 mg total) by mouth daily. 90 tablet 3   No current facility-administered medications on file prior to visit.     Past Surgical History  Procedure Laterality Date  . Hernia repair      16-72 years of age    Review of Systems  Respiratory: Negative for chest tightness and shortness of breath.   Cardiovascular: Negative for chest pain, palpitations and leg swelling.  Neurological: Negative for headaches.      Objective:    BP 110/80 mmHg  Pulse 95  Temp(Src) 98.6 F (37 C) (Oral)  Resp 18  Ht  (1.626 m)  Wt 199 lb (90.266 kg)  BMI 34.14 kg/m2  SpO2 97% Nursing note and vital signs reviewed.  Physical Exam  Constitutional: He is oriented to person,  place, and time. He appears well-developed and well-nourished. No distress.  Cardiovascular: Normal rate, regular rhythm, normal heart sounds and intact distal pulses.   Pulmonary/Chest: Effort normal and breath sounds normal.  Neurological: He is alert and oriented to person, place, and time.  Skin: Skin is warm and dry.  Psychiatric: He has a normal mood and affect. His behavior is normal. Judgment and thought content normal.       Assessment & Plan:   Problem List Items Addressed This Visit      Cardiovascular and Mediastinum   Essential hypertension    Blood pressure below goal of 140/90 and takes the medication sporadically. Has lost 7 pounds since last visit. Continue to monitor blood pressure at home as able. Continue current dose of amlodipine. Follow up in 6 months.         Other   Anxiety - Primary    Anxiety is well controlled with current regimen. Has been out of medication secondary to not obtaining a refill. Denies adverse reactions. Continue current dose of Xanax.       Relevant Medications   ALPRAZolam (XANAX) 0.25 MG tablet

## 2015-04-10 NOTE — Progress Notes (Signed)
Pre visit review using our clinic review tool, if applicable. No additional management support is needed unless otherwise documented below in the visit note. 

## 2015-04-10 NOTE — Patient Instructions (Signed)
Thank you for choosing [facility].  Summary/Instructions:  Your prescription(s) have been submitted to your pharmacy or been printed and provided for you. Please take as directed and contact our office if you believe you are having problem(s) with the medication(s) or have any questions.  If your symptoms worsen or fail to improve, please contact our office for further instruction, or in case of emergency go directly to the emergency room at the closest medical facility.   Please continue your medications as prescribed.   

## 2015-06-02 ENCOUNTER — Telehealth: Payer: Self-pay | Admitting: Family

## 2015-06-02 DIAGNOSIS — F419 Anxiety disorder, unspecified: Secondary | ICD-10-CM

## 2015-06-02 NOTE — Telephone Encounter (Signed)
Patient is requesting a refill of ALPRAZolam (XANAX) 0.25 MG tablet [[id number]] sent to [facility]

## 2015-06-03 MED ORDER — ALPRAZOLAM 0.25 MG PO TABS: 0.2500 mg | ORAL_TABLET | Freq: Two times a day (BID) | ORAL | Status: DC | PRN

## 2015-06-03 NOTE — Telephone Encounter (Signed)
rx has been sent 

## 2015-10-06 ENCOUNTER — Ambulatory Visit (INDEPENDENT_AMBULATORY_CARE_PROVIDER_SITE_OTHER): Payer: 59 | Admitting: Family

## 2015-10-06 ENCOUNTER — Encounter: Payer: Self-pay | Admitting: Family

## 2015-10-06 VITALS — BP 120/80 | HR 93 | Resp 16 | Ht 64.0 in | Wt 196.0 lb

## 2015-10-06 DIAGNOSIS — F419 Anxiety disorder, unspecified: Secondary | ICD-10-CM

## 2015-10-06 DIAGNOSIS — I10 Essential (primary) hypertension: Secondary | ICD-10-CM | POA: Diagnosis not present

## 2015-10-06 NOTE — Patient Instructions (Addendum)
Thank you for choosing [facility].  Summary/Instructions:  Please continue to take your medications as prescribed.   Follow up in 6 months.   If your symptoms worsen or fail to improve, please contact our office for further instruction, or in case of emergency go directly to the emergency room at the closest medical facility.

## 2015-10-06 NOTE — Assessment & Plan Note (Signed)
Hypertension is well-controlled and below goal 140/90 with current regimen. No adverse side effects or symptoms of end organ damage. Encouraged to monitor blood pressure at home as able. Continue current dosage of amlodipine. Follow-up in 6 months or sooner if needed.

## 2015-10-06 NOTE — Progress Notes (Signed)
Pre visit review using our clinic review tool, if applicable. No additional management support is needed unless otherwise documented below in the visit note. 

## 2015-10-06 NOTE — Assessment & Plan Note (Signed)
Anxiety appears well controlled with current dosage of alprazolam with no adverse side effects. Takes medication as needed and fairly infrequently. Continue current dosage of alprazolam. Follow-up in 6 months or sooner if needed.

## 2015-10-06 NOTE — Progress Notes (Signed)
   Subjective:    Patient ID: Christopher Mercado, male    DOB: 06/03/1976, 40 y.o.   MRN: [id number]  Chief Complaint  Patient presents with  . Follow-up    says he takes the xanax 2 days a week does not take them often    HPI:  Christopher Mercado is a 40 y.o. 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 and presents today for a follow up office visit.  1.) Anxiety - Currently managed on alprazolam. Reports taking the medication as prescribed and denies adverse side effects. Currently taking the Xanax 2 days per week. Notes his symptoms are well controlled with the medication. States that he knows when he needs it.  2.) Hypertension - Currently maintained on amlodipine. Reports taking the medication as prescribed and denies adverse side effects. Does not currently take his blood pressure at home. Denies symptoms of end organ damage.  BP Readings from Last 3 Encounters:  10/06/15 120/80  04/10/15 110/80  08/29/14 142/100     Allergies  Allergen Reactions  . Prednisone Rash     Current Outpatient Prescriptions on File Prior to Visit  Medication Sig Dispense Refill  . ALPRAZolam (XANAX) 0.25 MG tablet Take 1 tablet (0.25 mg total) by mouth 2 (two) times daily as needed for anxiety or sleep. 60 tablet 0  . amLODipine (NORVASC) 5 MG tablet Take 1 tablet (5 mg total) by mouth daily. 90 tablet 3   No current facility-administered medications on file prior to visit.    Review of Systems  Constitutional: Negative for fever and chills.  Respiratory: Negative for chest tightness, shortness of breath and wheezing.   Cardiovascular: Negative for chest pain, palpitations and leg swelling.  Neurological: Negative for headaches.  Psychiatric/Behavioral: Negative for behavioral problems, sleep disturbance and agitation. The patient is not  nervous/anxious.       Objective:    BP 120/80 mmHg  Pulse 93  Resp 16  Ht 5\' 4"  (1.626 m)  Wt 196 lb (88.905 kg)  BMI 33.63 kg/m2  SpO2 96% Nursing note and vital signs reviewed.  Physical Exam  Constitutional: He is oriented to person, place, and time. He appears well-developed and well-nourished. No distress.  Cardiovascular: Normal rate, regular rhythm, normal heart sounds and intact distal pulses.   Pulmonary/Chest: Effort normal and breath sounds normal.  Neurological: He is alert and oriented to person, place, and time.  Skin: Skin is warm and dry.  Psychiatric: He has a normal mood and affect. His behavior is normal. Judgment and thought content normal.       Assessment & Plan:   Problem List Items Addressed This Visit      Cardiovascular and Mediastinum   Essential hypertension    Hypertension is well-controlled and below goal 140/90 with current regimen. No adverse side effects or symptoms of end organ damage. Encouraged to monitor blood pressure at home as able. Continue current dosage of amlodipine. Follow-up in 6 months or sooner if needed.        Other   Anxiety - Primary    Anxiety appears well controlled with current dosage of alprazolam with no adverse side effects. Takes medication as needed and fairly infrequently. Continue current dosage of alprazolam. Follow-up in 6 months or sooner if needed.

## 2015-11-09 ENCOUNTER — Encounter: Payer: Self-pay | Admitting: Gastroenterology

## 2015-12-15 ENCOUNTER — Ambulatory Visit: Payer: 59 | Admitting: Adult Health

## 2015-12-16 ENCOUNTER — Other Ambulatory Visit (INDEPENDENT_AMBULATORY_CARE_PROVIDER_SITE_OTHER): Payer: 59

## 2015-12-16 ENCOUNTER — Ambulatory Visit (INDEPENDENT_AMBULATORY_CARE_PROVIDER_SITE_OTHER): Payer: 59 | Admitting: Family

## 2015-12-16 ENCOUNTER — Ambulatory Visit: Payer: 59 | Admitting: Family

## 2015-12-16 ENCOUNTER — Encounter: Payer: Self-pay | Admitting: Family

## 2015-12-16 VITALS — BP 130/90 | HR 86 | Temp 98.2°F | Resp 16 | Ht 64.0 in | Wt 189.0 lb

## 2015-12-16 DIAGNOSIS — R202 Paresthesia of skin: Secondary | ICD-10-CM

## 2015-12-16 DIAGNOSIS — R200 Anesthesia of skin: Secondary | ICD-10-CM | POA: Insufficient documentation

## 2015-12-16 LAB — IBC PANEL
Iron: 107 ug/dL (ref 42–165)
SATURATION RATIOS: 26.8 % (ref 20.0–50.0)
TRANSFERRIN: 285 mg/dL (ref 212.0–360.0)

## 2015-12-16 LAB — VITAMIN B12: Vitamin B-12: 235 pg/mL (ref 211–911)

## 2015-12-16 LAB — HEMOGLOBIN A1C: Hgb A1c MFr Bld: 8.9 % — ABNORMAL HIGH (ref 4.6–6.5)

## 2015-12-16 NOTE — Patient Instructions (Signed)
Thank you for choosing [facility].  Summary/Instructions:   Please stop by the lab on the basement level of the building for your blood work. Your results will be released to MyChart (or called to you) after review, usually within 72 hours after test completion. If any changes need to be made, you will be notified at that same time.  If your symptoms worsen or fail to improve, please contact our office for further instruction, or in case of emergency go directly to the emergency room at the closest medical facility.    Peripheral Neuropathy Peripheral neuropathy is a type of nerve damage. It affects nerves that carry signals between the spinal cord and other parts of the body. These are called peripheral nerves. With peripheral neuropathy, one nerve or a group of nerves may be damaged.  CAUSES  Many things can damage peripheral nerves. For some people with peripheral neuropathy, the cause is unknown. Some causes include:  Diabetes. This is the most common cause of peripheral neuropathy.  Injury to a nerve.  Pressure or stress on a nerve that lasts a long time.  Too little vitamin B. Alcoholism can lead to this.  Infections.  Autoimmune diseases, such as multiple sclerosis and systemic lupus erythematosus.  Inherited nerve diseases.  Some medicines, such as cancer drugs.  Toxic substances, such as lead and mercury.  Too little blood flowing to the legs.  Kidney disease.  Thyroid disease. SIGNS AND SYMPTOMS  Different people have different symptoms. The symptoms you have will depend on which of your nerves is damaged. Common symptoms include:  Loss of feeling (numbness) in the feet and hands.  Tingling in the feet and hands.  Pain that burns.  Very sensitive skin.  Weakness.  Not being able to move a part of the body (paralysis).  Muscle twitching.  Clumsiness or poor coordination.  Loss of balance.  Not being able to control your bladder.  Feeling  dizzy.  Sexual problems. DIAGNOSIS  Peripheral neuropathy is a symptom, not a disease. Finding the cause of peripheral neuropathy can be hard. To figure that out, your health care provider will take a medical history and do a physical exam. A neurological exam will also be done. This involves checking things affected by your brain, spinal cord, and nerves (nervous system). For example, your health care provider will check your reflexes, how you move, and what you can feel.  Other types of tests may also be ordered, such as:  Blood tests.  A test of the fluid in your spinal cord.  Imaging tests, such as CT scans or an MRI.  Electromyography (EMG). This test checks the nerves that control muscles.  Nerve conduction velocity tests. These tests check how fast messages pass through your nerves.  Nerve biopsy. A small piece of nerve is removed. It is then checked under a microscope. TREATMENT   Medicine is often used to treat peripheral neuropathy. Medicines may include:  Pain-relieving medicines. Prescription or over-the-counter medicine may be suggested.  Antiseizure medicine. This may be used for pain.  Antidepressants. These also may help ease pain from neuropathy.  Lidocaine. This is a numbing medicine. You might wear a patch or be given a shot.  Mexiletine. This medicine is typically used to help control irregular heart rhythms.  Surgery. Surgery may be needed to relieve pressure on a nerve or to destroy a nerve that is causing pain.  Physical therapy to help movement.  Assistive devices to help movement. HOME CARE INSTRUCTIONS     Only take over-the-counter or prescription medicines as directed by your health care provider. Follow the instructions carefully for any given medicines. Do not take any other medicines without first getting approval from your health care provider.  If you have diabetes, work closely with your health care provider to keep your blood sugar under  control.  If you have numbness in your feet:  Check every day for signs of injury or infection. Watch for redness, warmth, and swelling.  Wear padded socks and comfortable shoes. These help protect your feet.  Do not do things that put pressure on your damaged nerve.  Do not smoke. Smoking keeps blood from getting to damaged nerves.  Avoid or limit alcohol. Too much alcohol can cause a lack of B vitamins. These vitamins are needed for healthy nerves.  Develop a good support system. Coping with peripheral neuropathy can be stressful. Talk to a mental health specialist or join a support group if you are struggling.  Follow up with your health care provider as directed. SEEK MEDICAL CARE IF:   You have new signs or symptoms of peripheral neuropathy.  You are struggling emotionally from dealing with peripheral neuropathy.  You have a fever. SEEK IMMEDIATE MEDICAL CARE IF:   You have an injury or infection that is not healing.  You feel very dizzy or begin vomiting.  You have chest pain.  You have trouble breathing.   This information is not intended to replace advice given to you by your health care provider. Make sure you discuss any questions you have with your health care provider.   Document Released: 06/17/2002 Document Revised: 03/09/2011 Document Reviewed: 03/04/2013 Elsevier Interactive Patient Education [employer]2016 [employer] Inc.

## 2015-12-16 NOTE — Progress Notes (Signed)
Subjective:    Patient ID: Christopher Mercado, male    DOB: May 23, 1976, 40 y.o.   MRN: [id number]  Chief Complaint  Patient presents with  . Leg Pain    having pain in both legs feels like right above the knees down are asleep it has been going on for a while but has gotten worse    HPI:  Christopher Mercado is a 40 y.o. 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 and presents today for an acute office visit.  This is a new problem. Associated symptom of a tingling sensation located above his bilateral knees that has been going on for several years with recent worsening. Denies back pain or trauma. Endorses that his left leg will "buckle" on occasion. Symptoms are generally worse when he lies down. Denies fevers or chills. Modifying factors include Tylenol which allowed him to sleep at night.   Allergies  Allergen Reactions  . Prednisone Rash     Current Outpatient Prescriptions on File Prior to Visit  Medication Sig Dispense Refill  . ALPRAZolam (XANAX) 0.25 MG tablet Take 1 tablet (0.25 mg total) by mouth 2 (two) times daily as needed for anxiety or sleep. 60 tablet 0  . amLODipine (NORVASC) 5 MG tablet Take 1 tablet (5 mg total) by mouth daily. 90 tablet 3   No current facility-administered medications on file prior to visit.    Past Medical History  Diagnosis Date  . 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 Polyuria   . 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 Gastritis   . Elevated LFTs   . Gastroparesis     Seen by GI once - has "lived with it"  . Esophageal reflux      Past Surgical History  Procedure Laterality Date  . Hernia repair      40-66 years of age      Review of Systems  Constitutional: Positive for unexpected weight change. Negative for fever and chills.  Respiratory: Negative  for chest tightness and shortness of breath.   Cardiovascular: Negative for chest pain, palpitations and leg swelling.  Endocrine: Positive for polyphagia.  Musculoskeletal: Negative for back pain.  Neurological: Negative for headaches.      Objective:    BP 130/90 mmHg  Pulse 86  Temp(Src) 98.2 F (36.8 C) (Oral)  Resp 16  Ht  (1.626 m)  Wt 189 lb (85.73 kg)  BMI 32.43 kg/m2  SpO2 95% Nursing note and vital signs reviewed.   Physical Exam  Constitutional: He is oriented to person, place, and time. He appears well-developed and well-nourished. No distress.  Cardiovascular: Normal rate, regular rhythm, normal heart sounds and intact distal pulses.   Pulmonary/Chest: Effort normal and breath sounds normal.  Musculoskeletal:  Bilateral knees - no obvious deformity, discoloration, or edema noted. There is no significant tenderness, masses, or crepitus present. Range of motion and strength are within normal limits. Ligamentous and meniscal testing are negative. There is decreased sensation in the right compared to left. Distal pulses and sensation are intact and appropriate.  Neurological: He is alert and oriented to person, place, and time.  Skin: Skin is warm and dry.  Psychiatric: He has a normal mood and affect. His behavior is normal. Judgment and thought content normal.       Assessment & Plan:   Problem List Items  Addressed This Visit      Other   Numbness and tingling - Primary    After examination patient reveals additional numbness and tingling in his toes. No significant evidence or cause for numbness and tingling of bilateral legs. Obtain B12, hemoglobin A1c, and IBC panel to rule out metabolic causes. Further evaluation and treatment pending blood work results.      Relevant Orders   B12 (Completed)   Hemoglobin A1c (Completed)   IBC panel (Completed)       I am having Christopher Mercado maintain his amLODipine and ALPRAZolam.   Follow-up: No Follow-up on  file.  Jeanine Luzalone, Mafalda Mcginniss, FNP

## 2015-12-16 NOTE — Assessment & Plan Note (Signed)
After examination patient reveals additional numbness and tingling in his toes. No significant evidence or cause for numbness and tingling of bilateral legs. Obtain B12, hemoglobin A1c, and IBC panel to rule out metabolic causes. Further evaluation and treatment pending blood work results.

## 2015-12-16 NOTE — Progress Notes (Signed)
Pre visit review using our clinic review tool, if applicable. No additional management support is needed unless otherwise documented below in the visit note. 

## 2015-12-17 ENCOUNTER — Telehealth: Payer: Self-pay | Admitting: Family

## 2015-12-17 DIAGNOSIS — F419 Anxiety disorder, unspecified: Secondary | ICD-10-CM

## 2015-12-17 DIAGNOSIS — E1142 Type 2 diabetes mellitus with diabetic polyneuropathy: Secondary | ICD-10-CM

## 2015-12-17 MED ORDER — METFORMIN HCL 500 MG PO TABS: 500.0000 mg | ORAL_TABLET | Freq: Two times a day (BID) | ORAL | Status: DC

## 2015-12-17 NOTE — Telephone Encounter (Signed)
Please inform patient that his blood work shows that his iron and B12 levels are normal. His A1c is 8.9 indicating that he has uncontrolled Type 2 diabetes which could be the cause of his current symptoms. I have sent a prescription for metformin to his pharmacy to help lower his blood sugars. I'd like for him to schedule an additional appointment to review diabetes prevention.

## 2015-12-18 MED ORDER — AMLODIPINE BESYLATE 5 MG PO TABS: 5.0000 mg | ORAL_TABLET | Freq: Every day | ORAL | Status: DC

## 2015-12-18 MED ORDER — ALPRAZOLAM 0.25 MG PO TABS: 0.2500 mg | ORAL_TABLET | Freq: Two times a day (BID) | ORAL | Status: DC | PRN

## 2015-12-18 MED FILL — ALPRAZolam 0.25 MG TABS: 0.25 | 30 days supply | Qty: 60 | Fill #0

## 2015-12-18 MED FILL — metFORMIN HCL 500 MG TABS: 500 | 90 days supply | Qty: 180 | Fill #0

## 2015-12-18 MED FILL — AMLODIPINE BESYLATE 5 MG TA: 5 | 90 days supply | Qty: 90 | Fill #0

## 2015-12-18 NOTE — Telephone Encounter (Signed)
Spoke with wife and clarified lab results. Will call back to schedule a follow up once she talks with husband.

## 2015-12-18 NOTE — Telephone Encounter (Signed)
Patients wife called about his labs and such. I informed her of all the labs and the medication. She has a lot of question about all this. Please give her a call as soon as you can. Thank you.

## 2015-12-24 ENCOUNTER — Ambulatory Visit (INDEPENDENT_AMBULATORY_CARE_PROVIDER_SITE_OTHER): Payer: 59 | Admitting: Family

## 2015-12-24 ENCOUNTER — Encounter: Payer: Self-pay | Admitting: Family

## 2015-12-24 VITALS — BP 112/78 | HR 73 | Temp 97.5°F | Ht 64.0 in | Wt 188.0 lb

## 2015-12-24 DIAGNOSIS — Z23 Encounter for immunization: Secondary | ICD-10-CM | POA: Diagnosis not present

## 2015-12-24 DIAGNOSIS — E118 Type 2 diabetes mellitus with unspecified complications: Secondary | ICD-10-CM

## 2015-12-24 DIAGNOSIS — E119 Type 2 diabetes mellitus without complications: Secondary | ICD-10-CM | POA: Diagnosis not present

## 2015-12-24 DIAGNOSIS — E1165 Type 2 diabetes mellitus with hyperglycemia: Secondary | ICD-10-CM | POA: Insufficient documentation

## 2015-12-24 DIAGNOSIS — IMO0002 Reserved for concepts with insufficient information to code with codable children: Secondary | ICD-10-CM | POA: Insufficient documentation

## 2015-12-24 MED ORDER — GLUCOSE BLOOD VI STRP: ORAL_STRIP | Status: DC

## 2015-12-24 MED ORDER — ONETOUCH VERIO FLEX SYSTEM W/DEVICE KIT: 1.0000 | PACK | Status: DC

## 2015-12-24 MED ORDER — UNISTIK 3 COMFORT MISC: 1.0000 | Freq: Every day | Status: DC

## 2015-12-24 MED FILL — TRUE METRIX GLUCOSE TEST ST: 25 days supply | Qty: 100 | Fill #0

## 2015-12-24 MED FILL — TRUEplus LANCETS 30G MISC: 25 days supply | Qty: 100 | Fill #0

## 2015-12-24 NOTE — Progress Notes (Signed)
Pre visit review using our clinic review tool, if applicable. No additional management support is needed unless otherwise documented below in the visit note. 

## 2015-12-24 NOTE — Progress Notes (Signed)
Subjective:    Patient ID: Christopher Mercado, male    DOB: 10/01/1975, 40 y.o.   MRN: [id number]  Chief Complaint  Patient presents with  . Diabetes    HPI:  Christopher Mercado is a 40 y.o. 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 and presents today for an office follow up.  1.) Type 2 diabetes - Recently evaluated in the office for numbness and tingling and noted to have an A1c of 8.9. Following results he was started on metformin. His wife reports that he has been taking the metformin as prescribed. Denies any adverse side effects.   Lab Results  Component Value Date   HGBA1C 8.9* 12/16/2015    Allergies  Allergen Reactions  . Prednisone Rash     Current Outpatient Prescriptions on File Prior to Visit  Medication Sig Dispense Refill  . ALPRAZolam (XANAX) 0.25 MG tablet Take 1 tablet (0.25 mg total) by mouth 2 (two) times daily as needed for anxiety or sleep. 60 tablet 0  . amLODipine (NORVASC) 5 MG tablet Take 1 tablet (5 mg total) by mouth daily. 90 tablet 3  . metFORMIN (GLUCOPHAGE) 500 MG tablet Take 1 tablet (500 mg total) by mouth 2 (two) times daily with a meal. 180 tablet 3   No current facility-administered medications on file prior to visit.    Review of Systems  Constitutional: Negative for fever and chills.  Eyes: Negative for visual disturbance.       Negative for changes in vision.  Respiratory: Negative for chest tightness and shortness of breath.   Cardiovascular: Negative for chest pain, palpitations and leg swelling.  Endocrine: Negative for polydipsia, polyphagia and polyuria.      Objective:    BP 112/78 mmHg  Pulse 73  Temp(Src) 97.5 F (36.4 C)  Ht 5' 4"  (1.626 m)  Wt 188 lb (85.276 kg)  BMI 32.25 kg/m2  SpO2 95% Nursing note and vital signs reviewed.  Physical Exam  Constitutional: He is  oriented to person, place, and time. He appears well-developed and well-nourished. No distress.  Cardiovascular: Normal rate, regular rhythm, normal heart sounds and intact distal pulses.   Pulmonary/Chest: Effort normal and breath sounds normal.  Neurological: He is alert and oriented to person, place, and time.  Skin: Skin is warm and dry.  Psychiatric: He has a normal mood and affect. His behavior is normal. Judgment and thought content normal.       Assessment & Plan:   Problem List Items Addressed This Visit      Endocrine   Type 2 diabetes mellitus ([place]) - Primary    Newly diagnosed Type 2 diabetes and started on metformin without any complications. Discussed associated risks with progression of diabetes and importance of lowering blood sugars to reduce risk for future complications. Declines diabetes education. Glucose meter provided with strips and lancets at the pharmacy. Continue current dosage of metformin. Follow-up in 3 months or sooner with questions.      Relevant Medications   glucose blood (ONE TOUCH ULTRA TEST) test strip   Blood Glucose Monitoring Suppl (ONETOUCH VERIO FLEX SYSTEM) w/Device KIT   Lancets Misc. (UNISTIK 3 COMFORT) MISC       I am having Christopher Mercado start on glucose blood, ONETOUCH VERIO FLEX SYSTEM, and UNISTIK 3 COMFORT. I am also having him maintain his metFORMIN, amLODipine, and ALPRAZolam.   Meds ordered this encounter  Medications  . glucose blood (ONE TOUCH ULTRA TEST) test strip    Sig: Use 1 strip per test. Test blood sugars 1-4 times daily as instructed.    Dispense:  100 each    Refill:  12    Substitution permissible per insurance coverage. Dx E11.9    Order Specific Question:  Supervising Provider    Answer:  Pricilla Holm A [[id number]]  . Blood Glucose Monitoring Suppl (ONETOUCH VERIO FLEX SYSTEM) w/Device KIT    Sig: 1 Device by Does not apply route as directed. Use the device 1-4 times daily to check your blood sugars as  instructed.    Dispense:  1 kit    Refill:  0    Substitution permissible per insurance coverage. Dx E11.9    Order Specific Question:  Supervising Provider    Answer:  Pricilla Holm A [[id number]]  . Lancets Misc. (UNISTIK 3 COMFORT) MISC    Sig: 1 Device by Does not apply route daily. Use 1 lancet per check. Test blood sugars 1-4 times daily as instructed.    Dispense:  100 each    Refill:  0    Substitution permissbile per insurance coverage Dx E11.9    Order Specific Question:  Supervising Provider    Answer:  Pricilla Holm A [[id number]]     Follow-up: Return if symptoms worsen or fail to improve.  Mauricio Po, FNP

## 2015-12-24 NOTE — Patient Instructions (Signed)
Thank you for choosing [facility].  Summary/Instructions:  Continue to take your metformin as prescribed.   Your prescription(s) have been submitted to your pharmacy or been printed and provided for you. Please take as directed and contact our office if you believe you are having problem(s) with the medication(s) or have any questions.  Basic Carbohydrate Counting for Diabetes Mellitus Carbohydrate counting is a method for keeping track of the amount of carbohydrates you eat. Eating carbohydrates naturally increases the level of sugar (glucose) in your blood, so it is important for you to know the amount that is okay for you to have in every meal. Carbohydrate counting helps keep the level of glucose in your blood within normal limits. The amount of carbohydrates allowed is different for every person. A dietitian can help you calculate the amount that is right for you. Once you know the amount of carbohydrates you can have, you can count the carbohydrates in the foods you want to eat. Carbohydrates are found in the following foods:  Grains, such as breads and cereals.  Dried beans and soy products.  Starchy vegetables, such as potatoes, peas, and corn.  Fruit and fruit juices.  Milk and yogurt.  Sweets and snack foods, such as cake, cookies, candy, chips, soft drinks, and fruit drinks. CARBOHYDRATE COUNTING There are two ways to count the carbohydrates in your food. You can use either of the methods or a combination of both. Reading the "Nutrition Facts" on Packaged Food The "Nutrition Facts" is an area that is included on the labels of almost all packaged food and beverages in the [place]. It includes the serving size of that food or beverage and information about the nutrients in each serving of the food, including the grams (g) of carbohydrate per serving.  Decide the number of servings of this food or beverage that you will be able to eat or drink. Multiply that number of  servings by the number of grams of carbohydrate that is listed on the label for that serving. The total will be the amount of carbohydrates you will be having when you eat or drink this food or beverage. Learning Standard Serving Sizes of Food When you eat food that is not packaged or does not include "Nutrition Facts" on the label, you need to measure the servings in order to count the amount of carbohydrates.A serving of most carbohydrate-rich foods contains about 15 g of carbohydrates. The following list includes serving sizes of carbohydrate-rich foods that provide 15 g ofcarbohydrate per serving:   1 slice of bread (1 oz) or 1 six-inch tortilla.    of a hamburger bun or English muffin.  4-6 crackers.   cup unsweetened dry cereal.    cup hot cereal.   cup rice or pasta.    cup mashed potatoes or  of a large baked potato.  1 cup fresh fruit or one small piece of fruit.    cup canned or frozen fruit or fruit juice.  1 cup milk.   cup plain fat-free yogurt or yogurt sweetened with artificial sweeteners.   cup cooked dried beans or starchy vegetable, such as peas, corn, or potatoes.  Decide the number of standard-size servings that you will eat. Multiply that number of servings by 15 (the grams of carbohydrates in that serving). For example, if you eat 2 cups of strawberries, you will have eaten 2 servings and 30 g of carbohydrates (2 servings x 15 g = 30 g). For foods such as soups  and casseroles, in which more than one food is mixed in, you will need to count the carbohydrates in each food that is included. EXAMPLE OF CARBOHYDRATE COUNTING Sample Dinner  3 oz chicken breast.   cup of brown rice.   cup of corn.  1 cup milk.   1 cup strawberries with sugar-free whipped topping.  Carbohydrate Calculation Step 1: Identify the foods that contain carbohydrates:   Rice.   Corn.   Milk.   Strawberries. Step 2:Calculate the number of servings eaten  of each:   2 servings of rice.   1 serving of corn.   1 serving of milk.   1 serving of strawberries. Step 3: Multiply each of those number of servings by 15 g:   2 servings of rice x 15 g = 30 g.   1 serving of corn x 15 g = 15 g.   1 serving of milk x 15 g = 15 g.   1 serving of strawberries x 15 g = 15 g. Step 4: Add together all of the amounts to find the total grams of carbohydrates eaten: 30 g + 15 g + 15 g + 15 g = 75 g.   This information is not intended to replace advice given to you by your health care provider. Make sure you discuss any questions you have with your health care provider.   Document Released: 06/27/2005 Document Revised: 07/18/2014 Document Reviewed: 05/24/2013 Elsevier Interactive Patient Education 2016 Elsevier Inc.  Type 2 Diabetes Mellitus, Adult Type 2 diabetes mellitus, often simply referred to as type 2 diabetes, is a long-lasting (chronic) disease. In type 2 diabetes, the pancreas does not make enough insulin (a hormone), the cells are less responsive to the insulin that is made (insulin resistance), or both. Normally, insulin moves sugars from food into the tissue cells. The tissue cells use the sugars for energy. The lack of insulin or the lack of normal response to insulin causes excess sugars to build up in the blood instead of going into the tissue cells. As a result, high blood sugar (hyperglycemia) develops. The effect of high sugar (glucose) levels can cause many complications. Type 2 diabetes was also previously called adult-onset diabetes, but it can occur at any age.  RISK FACTORS  A person is predisposed to developing type 2 diabetes if someone in the family has the disease and also has one or more of the following primary risk factors:  Weight gain, or being overweight or obese.  An inactive lifestyle.  A history of consistently eating high-calorie foods. Maintaining a normal weight and regular physical activity can reduce  the chance of developing type 2 diabetes. SYMPTOMS  A person with type 2 diabetes may not show symptoms initially. The symptoms of type 2 diabetes appear slowly. The symptoms include:  Increased thirst (polydipsia).  Increased urination (polyuria).  Increased urination during the night (nocturia).  Sudden or unexplained weight changes.  Frequent, recurring infections.  Tiredness (fatigue).  Weakness.  Vision changes, such as blurred vision.  Fruity smell to your breath.  Abdominal pain.  Nausea or vomiting.  Cuts or bruises which are slow to heal.  Tingling or numbness in the hands or feet.  An open skin wound (ulcer). DIAGNOSIS Type 2 diabetes is frequently not diagnosed until complications of diabetes are present. Type 2 diabetes is diagnosed when symptoms or complications are present and when blood glucose levels are increased. Your blood glucose level may be checked by one or more of  the following blood tests:  A fasting blood glucose test. You will not be allowed to eat for at least 8 hours before a blood sample is taken.  A random blood glucose test. Your blood glucose is checked at any time of the day regardless of when you ate.  A hemoglobin A1c blood glucose test. A hemoglobin A1c test provides information about blood glucose control over the previous 3 months.  An oral glucose tolerance test (OGTT). Your blood glucose is measured after you have not eaten (fasted) for 2 hours and then after you drink a glucose-containing beverage. TREATMENT   You may need to take insulin or diabetes medicine daily to keep blood glucose levels in the desired range.  If you use insulin, you may need to adjust the dosage depending on the carbohydrates that you eat with each meal or snack.  Lifestyle changes are recommended as part of your treatment. These may include:  Following an individualized diet plan developed by a nutritionist or dietitian.  Exercising daily. Your  health care providers will set individualized treatment goals for you based on your age, your medicines, how long you have had diabetes, and any other medical conditions you have. Generally, the goal of treatment is to maintain the following blood glucose levels:  Before meals (preprandial): 80-130 mg/dL.  After meals (postprandial): below 180 mg/dL.  A1c: less than 6.5-7%. HOME CARE INSTRUCTIONS   Have your hemoglobin A1c level checked twice a year.  Perform daily blood glucose monitoring as directed by your health care provider.  Monitor urine ketones when you are ill and as directed by your health care provider.  Take your diabetes medicine or insulin as directed by your health care provider to maintain your blood glucose levels in the desired range.  Never run out of diabetes medicine or insulin. It is needed every day.  If you are using insulin, you may need to adjust the amount of insulin given based on your intake of carbohydrates. Carbohydrates can raise blood glucose levels but need to be included in your diet. Carbohydrates provide vitamins, minerals, and fiber which are an essential part of a healthy diet. Carbohydrates are found in fruits, vegetables, whole grains, dairy products, legumes, and foods containing added sugars.  Eat healthy foods. You should make an appointment to see a registered dietitian to help you create an eating plan that is right for you.  Lose weight if you are overweight.  Carry a medical alert card or wear your medical alert jewelry.  Carry a 15-gram carbohydrate snack with you at all times to treat low blood glucose (hypoglycemia). Some examples of 15-gram carbohydrate snacks include:  Glucose tablets, 3 or 4.  Glucose gel, 15-gram tube.  Raisins, 2 tablespoons (24 grams).  Jelly beans, 6.  Animal crackers, 8.  Regular pop, 4 ounces (120 mL).  Gummy treats, 9.  Recognize hypoglycemia. Hypoglycemia occurs with blood glucose levels of 70  mg/dL and below. The risk for hypoglycemia increases when fasting or skipping meals, during or after intense exercise, and during sleep. Hypoglycemia symptoms can include:  Tremors or shakes.  Decreased ability to concentrate.  Sweating.  Increased heart rate.  Headache.  Dry mouth.  Hunger.  Irritability.  Anxiety.  Restless sleep.  Altered speech or coordination.  Confusion.  Treat hypoglycemia promptly. If you are alert and able to safely swallow, follow the 15:15 rule:  Take 15-20 grams of rapid-acting glucose or carbohydrate. Rapid-acting options include glucose gel, glucose tablets, or 4 ounces (120  mL) of fruit juice, regular soda, or low-fat milk.  Check your blood glucose level 15 minutes after taking the glucose.  Take 15-20 grams more of glucose if the repeat blood glucose level is still 70 mg/dL or below.  Eat a meal or snack within 1 hour once blood glucose levels return to normal.  Be alert to feeling very thirsty and urinating more frequently than usual, which are early signs of hyperglycemia. An early awareness of hyperglycemia allows for prompt treatment. Treat hyperglycemia as directed by your health care provider.  Engage in at least 150 minutes of moderate-intensity physical activity a week, spread over at least 3 days of the week or as directed by your health care provider. In addition, you should engage in resistance exercise at least 2 times a week or as directed by your health care provider. Try to spend no more than 90 minutes at one time inactive.  Adjust your medicine and food intake as needed if you start a new exercise or sport.  Follow your sick-day plan anytime you are unable to eat or drink as usual.  Do not use any tobacco products including cigarettes, chewing tobacco, or electronic cigarettes. If you need help quitting, ask your health care provider.  Limit alcohol intake to no more than 1 drink per day for nonpregnant women and 2  drinks per day for men. You should drink alcohol only when you are also eating food. Talk with your health care provider whether alcohol is safe for you. Tell your health care provider if you drink alcohol several times a week.  Keep all follow-up visits as directed by your health care provider. This is important.  Schedule an eye exam soon after the diagnosis of type 2 diabetes and then annually.  Perform daily skin and foot care. Examine your skin and feet daily for cuts, bruises, redness, nail problems, bleeding, blisters, or sores. A foot exam by a health care provider should be done annually.  Brush your teeth and gums at least twice a day and floss at least once a day. Follow up with your dentist regularly.  Share your diabetes management plan with your workplace or school.  Keep your immunizations up to date. It is recommended that you receive a flu (influenza) vaccine every year. It is also recommended that you receive a pneumonia (pneumococcal) vaccine. If you are 69 years of age or older and have never received a pneumonia vaccine, this vaccine may be given as a series of two separate shots. Ask your health care provider which additional vaccines may be recommended.  Learn to manage stress.  Obtain ongoing diabetes education and support as needed.  Participate in or seek rehabilitation as needed to maintain or improve independence and quality of life. Request a physical or occupational therapy referral if you are having foot or hand numbness, or difficulties with grooming, dressing, eating, or physical activity. SEEK MEDICAL CARE IF:   You are unable to eat food or drink fluids for more than 6 hours.  You have nausea and vomiting for more than 6 hours.  Your blood glucose level is over 240 mg/dL.  There is a change in mental status.  You develop an additional serious illness.  You have diarrhea for more than 6 hours.  You have been sick or have had a fever for a couple of  days and are not getting better.  You have pain during any physical activity.  SEEK IMMEDIATE MEDICAL CARE IF:  You have difficulty  breathing.  You have moderate to large ketone levels.   This information is not intended to replace advice given to you by your health care provider. Make sure you discuss any questions you have with your health care provider.   Document Released: 06/27/2005 Document Revised: 03/18/2015 Document Reviewed: 01/24/2012 Elsevier Interactive Patient Education [employer]

## 2015-12-24 NOTE — Assessment & Plan Note (Addendum)
Newly diagnosed Type 2 diabetes and started on metformin without any complications. Discussed associated risks with progression of diabetes and importance of lowering blood sugars to reduce risk for future complications. Declines diabetes education. Glucose meter provided with strips and lancets at the pharmacy. Continue current dosage of metformin. Follow-up in 3 months or sooner with questions.

## 2016-02-26 ENCOUNTER — Emergency Department (HOSPITAL_COMMUNITY): Payer: No Typology Code available for payment source

## 2016-02-26 ENCOUNTER — Emergency Department (HOSPITAL_COMMUNITY)
Admission: EM | Admit: 2016-02-26 | Discharge: 2016-02-27 | Disposition: A | Discharge status: Home / Self Care | Payer: No Typology Code available for payment source | Attending: Emergency Medicine | Admitting: Emergency Medicine

## 2016-02-26 ENCOUNTER — Encounter (HOSPITAL_COMMUNITY): Payer: Self-pay | Admitting: Emergency Medicine

## 2016-02-26 DIAGNOSIS — S79921A Unspecified injury of right thigh, initial encounter: Secondary | ICD-10-CM | POA: Diagnosis not present

## 2016-02-26 DIAGNOSIS — I10 Essential (primary) hypertension: Secondary | ICD-10-CM | POA: Insufficient documentation

## 2016-02-26 DIAGNOSIS — E119 Type 2 diabetes mellitus without complications: Secondary | ICD-10-CM | POA: Insufficient documentation

## 2016-02-26 DIAGNOSIS — Z7984 Long term (current) use of oral hypoglycemic drugs: Secondary | ICD-10-CM | POA: Diagnosis not present

## 2016-02-26 DIAGNOSIS — M25511 Pain in right shoulder: Secondary | ICD-10-CM | POA: Diagnosis not present

## 2016-02-26 DIAGNOSIS — Y92410 Unspecified street and highway as the place of occurrence of the external cause: Secondary | ICD-10-CM | POA: Insufficient documentation

## 2016-02-26 DIAGNOSIS — S43101A Unspecified dislocation of right acromioclavicular joint, initial encounter: Secondary | ICD-10-CM | POA: Insufficient documentation

## 2016-02-26 DIAGNOSIS — M79651 Pain in right thigh: Secondary | ICD-10-CM | POA: Diagnosis not present

## 2016-02-26 DIAGNOSIS — R200 Anesthesia of skin: Secondary | ICD-10-CM | POA: Diagnosis not present

## 2016-02-26 DIAGNOSIS — S3993XA Unspecified injury of pelvis, initial encounter: Secondary | ICD-10-CM | POA: Diagnosis not present

## 2016-02-26 DIAGNOSIS — F17210 Nicotine dependence, cigarettes, uncomplicated: Secondary | ICD-10-CM | POA: Diagnosis not present

## 2016-02-26 DIAGNOSIS — S4991XA Unspecified injury of right shoulder and upper arm, initial encounter: Secondary | ICD-10-CM | POA: Diagnosis present

## 2016-02-26 DIAGNOSIS — Y939 Activity, unspecified: Secondary | ICD-10-CM | POA: Insufficient documentation

## 2016-02-26 DIAGNOSIS — Y999 Unspecified external cause status: Secondary | ICD-10-CM | POA: Diagnosis not present

## 2016-02-26 DIAGNOSIS — S299XXA Unspecified injury of thorax, initial encounter: Secondary | ICD-10-CM | POA: Diagnosis not present

## 2016-02-26 HISTORY — DX: Type 2 diabetes mellitus without complications: E11.9

## 2016-02-26 HISTORY — DX: Essential (primary) hypertension: I10

## 2016-02-26 LAB — I-STAT TROPONIN, ED: TROPONIN I, POC: 0.01 ng/mL (ref 0.00–0.08)

## 2016-02-26 LAB — I-STAT CHEM 8, ED
BUN: 9 mg/dL (ref 6–20)
CALCIUM ION: 1.1 mmol/L — AB (ref 1.13–1.30)
CHLORIDE: 102 mmol/L (ref 101–111)
Creatinine, Ser: 0.7 mg/dL (ref 0.61–1.24)
Glucose, Bld: 236 mg/dL — ABNORMAL HIGH (ref 65–99)
HEMATOCRIT: 47 % (ref 39.0–52.0)
Hemoglobin: 16 g/dL (ref 13.0–17.0)
POTASSIUM: 3.6 mmol/L (ref 3.5–5.1)
Sodium: 140 mmol/L (ref 135–145)
TCO2: 25 mmol/L (ref 0–100)

## 2016-02-26 MED ORDER — SODIUM CHLORIDE 0.9 % IV BOLUS (SEPSIS)
1000.0000 mL | Freq: Once | INTRAVENOUS | Status: AC
  Administered 2016-02-26: 1000 mL via INTRAVENOUS
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
## 2016-02-26 NOTE — Progress Notes (Signed)
Orthopedic Tech Progress Note Patient Details:  Christopher FiddlerJames A Mercado 04/06/1976 [id number]  Ortho Devices Type of Ortho Device: Shoulder immobilizer Ortho Device/Splint Location: rue Ortho Device/Splint Interventions: Ordered, Application   Trinna PostMartinez, Domenick Quebedeaux J 02/26/2016, 11:49 PM

## 2016-02-26 NOTE — ED Notes (Signed)
Pt ambulated on his room with steady gait. NAD noticed, family at the bedside.
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
## 2016-02-27 MED ORDER — OXYCODONE-ACETAMINOPHEN 5-325 MG PO TABS
2.0000 | ORAL_TABLET | Freq: Once | ORAL | Status: AC
  Administered 2016-02-27: 2 via ORAL
  Filled 2016-02-27: qty 2

## 2016-02-27 MED ORDER — IBUPROFEN 400 MG PO TABS
400.0000 mg | ORAL_TABLET | Freq: Once | ORAL | Status: AC
  Administered 2016-02-27: 400 mg via ORAL
  Filled 2016-02-27: qty 1

## 2016-02-29 ENCOUNTER — Encounter: Payer: Self-pay | Admitting: Family

## 2016-02-29 ENCOUNTER — Other Ambulatory Visit (HOSPITAL_COMMUNITY): Payer: Self-pay | Admitting: Sports Medicine

## 2016-02-29 DIAGNOSIS — S43101A Unspecified dislocation of right acromioclavicular joint, initial encounter: Secondary | ICD-10-CM | POA: Diagnosis not present

## 2016-02-29 DIAGNOSIS — M25512 Pain in left shoulder: Secondary | ICD-10-CM | POA: Diagnosis not present

## 2016-02-29 DIAGNOSIS — M25532 Pain in left wrist: Secondary | ICD-10-CM | POA: Diagnosis not present

## 2016-03-02 ENCOUNTER — Encounter (HOSPITAL_BASED_OUTPATIENT_CLINIC_OR_DEPARTMENT_OTHER): Payer: Self-pay | Admitting: *Deleted

## 2016-03-02 DIAGNOSIS — M25512 Pain in left shoulder: Secondary | ICD-10-CM | POA: Diagnosis not present

## 2016-03-03 ENCOUNTER — Other Ambulatory Visit: Payer: Self-pay

## 2016-03-03 ENCOUNTER — Ambulatory Visit (INDEPENDENT_AMBULATORY_CARE_PROVIDER_SITE_OTHER): Payer: 59 | Admitting: Family

## 2016-03-03 ENCOUNTER — Encounter (HOSPITAL_BASED_OUTPATIENT_CLINIC_OR_DEPARTMENT_OTHER)
Admission: RE | Admit: 2016-03-03 | Discharge: 2016-03-03 | Disposition: A | Discharge status: Home / Self Care | Payer: No Typology Code available for payment source | Source: Ambulatory Visit | Attending: Orthopedic Surgery | Admitting: Orthopedic Surgery

## 2016-03-03 ENCOUNTER — Encounter: Payer: Self-pay | Admitting: Family

## 2016-03-03 VITALS — BP 120/86 | HR 102 | Temp 98.0°F | Resp 18 | Ht 69.0 in | Wt 183.0 lb

## 2016-03-03 DIAGNOSIS — M25532 Pain in left wrist: Secondary | ICD-10-CM | POA: Diagnosis not present

## 2016-03-03 DIAGNOSIS — E119 Type 2 diabetes mellitus without complications: Secondary | ICD-10-CM

## 2016-03-03 DIAGNOSIS — R938 Abnormal findings on diagnostic imaging of other specified body structures: Secondary | ICD-10-CM | POA: Diagnosis not present

## 2016-03-03 LAB — GLUCOSE, POCT (MANUAL RESULT ENTRY): POC GLUCOSE: 278 mg/dL — AB (ref 70–99)

## 2016-03-03 MED ORDER — INSULIN PEN NEEDLE 32G X 4 MM MISC: 0 refills | Status: DC

## 2016-03-03 MED ORDER — INSULIN DEGLUDEC 100 UNIT/ML ~~LOC~~ SOPN: 10.0000 [IU] | PEN_INJECTOR | Freq: Every day | SUBCUTANEOUS | 0 refills | Status: DC

## 2016-03-03 MED FILL — HYDROCODON-APAP 5-325: 5-325 | 7 days supply | Qty: 30 | Fill #0

## 2016-03-03 NOTE — Patient Instructions (Signed)
Thank you for choosing [facility].  SUMMARY AND INSTRUCTIONS:  Medication:  Your prescription(s) have been submitted to your pharmacy or been printed and provided for you. Please take as directed and contact our office if you believe you are having problem(s) with the medication(s) or have any questions.  Follow up:  If your symptoms worsen or fail to improve, please contact our office for further instruction, or in case of emergency go directly to the emergency room at the closest medical facility.   DIABETES CARE INSTRUCTIONS:  Current A1c:  Lab Results  Component Value Date   HGBA1C 8.9 (H) 12/16/2015    A1C Goal: <7.0%  Fasting Blood Sugar Goal: 80-130 Blood sugar check that you take after fasting for at least 8 hours which is usually in the morning before eating.  Post-prandial Blood Sugar Goal: <180 Blood sugar check approximately 1-2 hours after the start of a meal.   Dosing of Long Acting Insulin:  1. Check and record your blood sugar each morning before eating.   2. Increase your current dosage by 2 units every 3 days until your fasting blood sugar is between 80-130. Alternatively you may increase 1 unit daily until your fasting blood sugar is between 80-130.  NOTE: If your blood sugars are less than 80, then decrease your dosage 2 units daily until your blood sugars are back in range.    Diabetes Prevention Screens:  1.) Ensure you have an annual eye exam by an ophthalmologist or optometrist.   2,) Ensure you have an annual foot exam or when any changes are noted including new onset numbness/tingling or wounds. Check your feet daily!  3.) The American Diabetes Association recommends the Pneumovax vaccination against pneumonia every 5 years.  4.) We will check your kidney function with a urine test on an annual basis.

## 2016-03-03 NOTE — Assessment & Plan Note (Signed)
Type 2 diabetes remains uncontrolled with current regimen secondary to patient non-compliance with medication regimen. Wishes to lower blood sugars by any means. Start [place]. Basic insulin administration instructions and demonstrations provided. Start at 10 units daily and increase 1 unit daily until goal is achieved. Due for diabetic eye exam. No maintained on statin or ACE/ARB. Declines pnuemovax. Continue to monitor blood sugars at home. Follow up pending surgery.
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
## 2016-03-04 ENCOUNTER — Ambulatory Visit (HOSPITAL_COMMUNITY)
Admission: RE | Admit: 2016-03-04 | Discharge: 2016-03-04 | Disposition: A | Discharge status: Home / Self Care | Payer: No Typology Code available for payment source | Source: Ambulatory Visit | Attending: Sports Medicine | Admitting: Sports Medicine

## 2016-03-04 ENCOUNTER — Ambulatory Visit (HOSPITAL_COMMUNITY): Admission: RE | Admit: 2016-03-04 | Payer: No Typology Code available for payment source | Source: Ambulatory Visit

## 2016-03-04 DIAGNOSIS — M25532 Pain in left wrist: Secondary | ICD-10-CM

## 2016-03-04 DIAGNOSIS — R938 Abnormal findings on diagnostic imaging of other specified body structures: Secondary | ICD-10-CM | POA: Insufficient documentation

## 2016-03-07 ENCOUNTER — Other Ambulatory Visit: Payer: Self-pay | Admitting: Orthopedic Surgery

## 2016-03-08 ENCOUNTER — Encounter (HOSPITAL_BASED_OUTPATIENT_CLINIC_OR_DEPARTMENT_OTHER)
Admission: RE | Disposition: A | Discharge status: Home / Self Care | Payer: Self-pay | Source: Ambulatory Visit | Attending: Orthopedic Surgery

## 2016-03-08 ENCOUNTER — Ambulatory Visit (HOSPITAL_BASED_OUTPATIENT_CLINIC_OR_DEPARTMENT_OTHER): Payer: 59 | Admitting: Anesthesiology

## 2016-03-08 ENCOUNTER — Ambulatory Visit (HOSPITAL_BASED_OUTPATIENT_CLINIC_OR_DEPARTMENT_OTHER)
Admission: RE | Admit: 2016-03-08 | Discharge: 2016-03-08 | Disposition: A | Discharge status: Home / Self Care | Payer: 59 | Source: Ambulatory Visit | Attending: Orthopedic Surgery | Admitting: Orthopedic Surgery

## 2016-03-08 ENCOUNTER — Encounter (HOSPITAL_BASED_OUTPATIENT_CLINIC_OR_DEPARTMENT_OTHER): Payer: Self-pay

## 2016-03-08 DIAGNOSIS — S43101A Unspecified dislocation of right acromioclavicular joint, initial encounter: Secondary | ICD-10-CM | POA: Diagnosis not present

## 2016-03-08 DIAGNOSIS — E119 Type 2 diabetes mellitus without complications: Secondary | ICD-10-CM | POA: Diagnosis not present

## 2016-03-08 DIAGNOSIS — I10 Essential (primary) hypertension: Secondary | ICD-10-CM | POA: Insufficient documentation

## 2016-03-08 DIAGNOSIS — Z794 Long term (current) use of insulin: Secondary | ICD-10-CM | POA: Insufficient documentation

## 2016-03-08 DIAGNOSIS — F17210 Nicotine dependence, cigarettes, uncomplicated: Secondary | ICD-10-CM | POA: Diagnosis not present

## 2016-03-08 DIAGNOSIS — G8918 Other acute postprocedural pain: Secondary | ICD-10-CM | POA: Diagnosis not present

## 2016-03-08 DIAGNOSIS — M25511 Pain in right shoulder: Secondary | ICD-10-CM | POA: Diagnosis not present

## 2016-03-08 DIAGNOSIS — S43131A Dislocation of right acromioclavicular joint, greater than 200% displacement, initial encounter: Secondary | ICD-10-CM

## 2016-03-08 DIAGNOSIS — Z79899 Other long term (current) drug therapy: Secondary | ICD-10-CM | POA: Diagnosis not present

## 2016-03-08 HISTORY — DX: Dislocation of right acromioclavicular joint, greater than 200% displacement, initial encounter: S43.131A

## 2016-03-08 HISTORY — PX: ACROMIO-CLAVICULAR JOINT REPAIR: SHX5183

## 2016-03-08 LAB — GLUCOSE, CAPILLARY
Glucose-Capillary: 178 mg/dL — ABNORMAL HIGH (ref 65–99)
Glucose-Capillary: 129 mg/dL — ABNORMAL HIGH (ref 65–99)

## 2016-03-08 SURGERY — REPAIR, ACROMIOCLAVICULAR JOINT
Anesthesia: General | Site: Shoulder | Laterality: Right

## 2016-03-08 MED ORDER — SUCCINYLCHOLINE CHLORIDE 200 MG/10ML IV SOSY
PREFILLED_SYRINGE | INTRAVENOUS | Status: AC
  Filled 2016-03-08: qty 10

## 2016-03-08 MED ORDER — LIDOCAINE HCL (CARDIAC) 20 MG/ML IV SOLN
INTRAVENOUS | Status: DC | PRN
  Administered 2016-03-08: 100 mg via INTRAVENOUS

## 2016-03-08 MED ORDER — GLYCOPYRROLATE 0.2 MG/ML IJ SOLN: 0.2000 mg | Freq: Once | INTRAMUSCULAR | Status: DC | PRN

## 2016-03-08 MED ORDER — BUPIVACAINE HCL (PF) 0.5 % IJ SOLN
INTRAMUSCULAR | Status: DC | PRN
  Administered 2016-03-08: 20 mL

## 2016-03-08 MED ORDER — BUPIVACAINE-EPINEPHRINE (PF) 0.5% -1:200000 IJ SOLN
INTRAMUSCULAR | Status: DC | PRN
  Administered 2016-03-08: 30 mL via PERINEURAL

## 2016-03-08 MED ORDER — ONDANSETRON HCL 4 MG/2ML IJ SOLN
INTRAMUSCULAR | Status: DC | PRN
  Administered 2016-03-08 (×2): 4 mg via INTRAVENOUS

## 2016-03-08 MED ORDER — FENTANYL CITRATE (PF) 100 MCG/2ML IJ SOLN
25.0000 ug | INTRAMUSCULAR | Status: DC | PRN
  Administered 2016-03-08: 50 ug via INTRAVENOUS

## 2016-03-08 MED ORDER — SODIUM CHLORIDE 0.9 % IR SOLN
Status: DC | PRN
  Administered 2016-03-08: 500 mL

## 2016-03-08 MED ORDER — SUCCINYLCHOLINE CHLORIDE 20 MG/ML IJ SOLN
INTRAMUSCULAR | Status: DC | PRN
  Administered 2016-03-08: 140 mg via INTRAVENOUS

## 2016-03-08 MED ORDER — ONDANSETRON HCL 4 MG PO TABS: 4.0000 mg | ORAL_TABLET | Freq: Three times a day (TID) | ORAL | 0 refills | Status: DC | PRN

## 2016-03-08 MED ORDER — ONDANSETRON HCL 4 MG/2ML IJ SOLN
INTRAMUSCULAR | Status: AC
  Filled 2016-03-08: qty 2

## 2016-03-08 MED ORDER — MIDAZOLAM HCL 2 MG/2ML IJ SOLN: 1.0000 mg | INTRAMUSCULAR | Status: DC | PRN

## 2016-03-08 MED ORDER — DEXAMETHASONE SODIUM PHOSPHATE 10 MG/ML IJ SOLN
INTRAMUSCULAR | Status: AC
  Filled 2016-03-08: qty 1

## 2016-03-08 MED ORDER — BACLOFEN 10 MG PO TABS: 10.0000 mg | ORAL_TABLET | Freq: Three times a day (TID) | ORAL | 0 refills | Status: DC

## 2016-03-08 MED ORDER — FENTANYL CITRATE (PF) 100 MCG/2ML IJ SOLN
INTRAMUSCULAR | Status: AC
  Filled 2016-03-08: qty 2

## 2016-03-08 MED ORDER — BUPIVACAINE HCL (PF) 0.5 % IJ SOLN
INTRAMUSCULAR | Status: AC
  Filled 2016-03-08: qty 30

## 2016-03-08 MED ORDER — CEFAZOLIN SODIUM-DEXTROSE 2-4 GM/100ML-% IV SOLN
INTRAVENOUS | Status: AC
  Filled 2016-03-08: qty 100

## 2016-03-08 MED ORDER — LACTATED RINGERS IV SOLN
INTRAVENOUS | Status: DC
  Administered 2016-03-08 (×3): via INTRAVENOUS

## 2016-03-08 MED ORDER — OXYCODONE HCL 5 MG PO TABS: 5.0000 mg | ORAL_TABLET | ORAL | 0 refills | Status: DC | PRN

## 2016-03-08 MED ORDER — METOCLOPRAMIDE HCL 5 MG/ML IJ SOLN: 10.0000 mg | Freq: Once | INTRAMUSCULAR | Status: DC | PRN

## 2016-03-08 MED ORDER — OXYCODONE-ACETAMINOPHEN 5-325 MG PO TABS
1.0000 | ORAL_TABLET | Freq: Once | ORAL | Status: AC | PRN
  Administered 2016-03-08: 1 via ORAL

## 2016-03-08 MED ORDER — PROPOFOL 10 MG/ML IV BOLUS
INTRAVENOUS | Status: DC | PRN
Start: 2016-03-08 — End: 2016-03-08
  Administered 2016-03-08: 200 mg via INTRAVENOUS

## 2016-03-08 MED ORDER — MIDAZOLAM HCL 2 MG/2ML IJ SOLN
INTRAMUSCULAR | Status: AC
  Filled 2016-03-08: qty 2

## 2016-03-08 MED ORDER — LACTATED RINGERS IV SOLN: INTRAVENOUS | Status: DC

## 2016-03-08 MED ORDER — LIDOCAINE 2% (20 MG/ML) 5 ML SYRINGE
INTRAMUSCULAR | Status: AC
  Filled 2016-03-08: qty 5

## 2016-03-08 MED ORDER — SCOPOLAMINE 1 MG/3DAYS TD PT72: 1.0000 | MEDICATED_PATCH | Freq: Once | TRANSDERMAL | Status: DC | PRN

## 2016-03-08 MED ORDER — MEPERIDINE HCL 25 MG/ML IJ SOLN: 6.2500 mg | INTRAMUSCULAR | Status: DC | PRN

## 2016-03-08 MED ORDER — SENNA-DOCUSATE SODIUM 8.6-50 MG PO TABS: 2.0000 | ORAL_TABLET | Freq: Every day | ORAL | 1 refills | Status: DC

## 2016-03-08 MED ORDER — OXYCODONE-ACETAMINOPHEN 5-325 MG PO TABS
ORAL_TABLET | ORAL | Status: AC
  Filled 2016-03-08: qty 1

## 2016-03-08 MED ORDER — ARTIFICIAL TEARS OP OINT
TOPICAL_OINTMENT | OPHTHALMIC | Status: AC
  Filled 2016-03-08: qty 3.5

## 2016-03-08 MED ORDER — FENTANYL CITRATE (PF) 100 MCG/2ML IJ SOLN
50.0000 ug | INTRAMUSCULAR | Status: AC | PRN
  Administered 2016-03-08: 25 ug via INTRAVENOUS
  Administered 2016-03-08: 50 ug via INTRAVENOUS
  Administered 2016-03-08: 100 ug via INTRAVENOUS
  Administered 2016-03-08: 25 ug via INTRAVENOUS
  Administered 2016-03-08: 50 ug via INTRAVENOUS
  Administered 2016-03-08: 25 ug via INTRAVENOUS
  Administered 2016-03-08: 50 ug via INTRAVENOUS

## 2016-03-08 MED ORDER — OXYCODONE HCL 5 MG PO TABS: 10.0000 mg | ORAL_TABLET | ORAL | Status: DC | PRN

## 2016-03-08 MED ORDER — CEFAZOLIN SODIUM-DEXTROSE 2-4 GM/100ML-% IV SOLN: 2.0000 g | INTRAVENOUS | Status: DC

## 2016-03-08 SURGICAL SUPPLY — 80 items
BLADE AVERAGE 25MMX9MM (BLADE) ×1
BLADE AVERAGE 25X9 (BLADE) ×2 IMPLANT
BLADE CLIPPER SURG (BLADE) IMPLANT
BLADE SURG 15 STRL LF DISP TIS (BLADE) ×2 IMPLANT
BLADE SURG 15 STRL SS (BLADE) ×6
CHLORAPREP W/TINT 26ML (MISCELLANEOUS) ×3 IMPLANT
CLOSURE STERI-STRIP 1/2X4 (GAUZE/BANDAGES/DRESSINGS) ×1
CLSR STERI-STRIP ANTIMIC 1/2X4 (GAUZE/BANDAGES/DRESSINGS) ×2 IMPLANT
DECANTER SPIKE VIAL GLASS SM (MISCELLANEOUS) IMPLANT
DRAPE C-ARM 42X72 X-RAY (DRAPES) IMPLANT
DRAPE IMP U-DRAPE 54X76 (DRAPES) ×3 IMPLANT
DRAPE INCISE IOBAN 66X45 STRL (DRAPES) IMPLANT
DRAPE OEC MINIVIEW 54X84 (DRAPES) ×3 IMPLANT
DRAPE U-SHAPE 47X51 STRL (DRAPES) ×6 IMPLANT
DRAPE U-SHAPE 76X120 STRL (DRAPES) ×6 IMPLANT
DRSG MEPILEX BORDER 4X8 (GAUZE/BANDAGES/DRESSINGS) ×2 IMPLANT
DRSG PAD ABDOMINAL 8X10 ST (GAUZE/BANDAGES/DRESSINGS) ×3 IMPLANT
ELECT BLADE 6.5 .24CM SHAFT (ELECTRODE) IMPLANT
ELECT REM PT RETURN 9FT ADLT (ELECTROSURGICAL) ×3
ELECTRODE REM PT RTRN 9FT ADLT (ELECTROSURGICAL) ×1 IMPLANT
FIBERSTICK 2 (SUTURE) ×3 IMPLANT
GAUZE SPONGE 4X4 12PLY STRL (GAUZE/BANDAGES/DRESSINGS) ×3 IMPLANT
GAUZE SPONGE 4X4 16PLY XRAY LF (GAUZE/BANDAGES/DRESSINGS) IMPLANT
GLOVE BIO SURGEON STRL SZ 6.5 (GLOVE) ×1 IMPLANT
GLOVE BIO SURGEON STRL SZ8 (GLOVE) ×3 IMPLANT
GLOVE BIO SURGEONS STRL SZ 6.5 (GLOVE) ×1
GLOVE BIOGEL PI IND STRL 7.0 (GLOVE) IMPLANT
GLOVE BIOGEL PI IND STRL 8 (GLOVE) ×2 IMPLANT
GLOVE BIOGEL PI INDICATOR 7.0 (GLOVE) ×4
GLOVE BIOGEL PI INDICATOR 8 (GLOVE) ×4
GLOVE ORTHO TXT STRL SZ7.5 (GLOVE) ×3 IMPLANT
GLOVE SURG ORTHO 8.0 STRL STRW (GLOVE) ×2 IMPLANT
GOWN STRL REUS W/ TWL LRG LVL3 (GOWN DISPOSABLE) ×1 IMPLANT
GOWN STRL REUS W/ TWL XL LVL3 (GOWN DISPOSABLE) ×2 IMPLANT
GOWN STRL REUS W/TWL LRG LVL3 (GOWN DISPOSABLE) ×6
GOWN STRL REUS W/TWL XL LVL3 (GOWN DISPOSABLE) ×6
GRAFT ROPE FROZEN (Tissue) ×3 IMPLANT
GRAFT TISS ROPE SEMITEND 4-5.5 (Tissue) IMPLANT
IMPL ACUTE AC REPAIR SYSTEM (Orthopedic Implant) IMPLANT
IMPLANT ACUTE AC REPAIR SYSTEM (Orthopedic Implant) ×3 IMPLANT
KIT BIO-TENODESIS 3X8 DISP (MISCELLANEOUS) ×3
KIT INSRT BABSR STRL DISP BTN (MISCELLANEOUS) ×1 IMPLANT
NS IRRIG 1000ML POUR BTL (IV SOLUTION) IMPLANT
PACK ARTHROSCOPY DSU (CUSTOM PROCEDURE TRAY) ×3 IMPLANT
PACK BASIN DAY SURGERY FS (CUSTOM PROCEDURE TRAY) ×3 IMPLANT
PASSER SUT SWANSON 36MM LOOP (INSTRUMENTS) IMPLANT
PENCIL BUTTON HOLSTER BLD 10FT (ELECTRODE) ×3 IMPLANT
SCREW PEEK TENODESIS 5.5X8 (Screw) ×2 IMPLANT
SHEET MEDIUM DRAPE 40X70 STRL (DRAPES) ×2 IMPLANT
SLEEVE SCD COMPRESS KNEE MED (MISCELLANEOUS) ×3 IMPLANT
SLING ARM FOAM STRAP LRG (SOFTGOODS) ×2 IMPLANT
SLING ARM IMMOBILIZER MED (SOFTGOODS) IMPLANT
SLING ARM MED ADULT FOAM STRAP (SOFTGOODS) IMPLANT
SLING ARM XL FOAM STRAP (SOFTGOODS) IMPLANT
SPONGE LAP 4X18 X RAY DECT (DISPOSABLE) ×6 IMPLANT
SUCTION FRAZIER HANDLE 10FR (MISCELLANEOUS) ×2
SUCTION TUBE FRAZIER 10FR DISP (MISCELLANEOUS) ×1 IMPLANT
SUPPORT WRAP ARM LG (MISCELLANEOUS) ×3 IMPLANT
SUT 2 FIBERLOOP 20 STRT BLUE (SUTURE) ×3
SUT ETHIBOND 2 OS 4 DA (SUTURE) IMPLANT
SUT ETHILON 4 0 PS 2 18 (SUTURE) IMPLANT
SUT FIBERWIRE #2 38 T-5 BLUE (SUTURE) ×3
SUT MNCRL AB 3-0 PS2 18 (SUTURE) IMPLANT
SUT MNCRL AB 4-0 PS2 18 (SUTURE) IMPLANT
SUT PDS AB 0 CT 36 (SUTURE) IMPLANT
SUT PROLENE 3 0 PS 2 (SUTURE) IMPLANT
SUT VIC AB 0 CT1 18XCR BRD 8 (SUTURE) IMPLANT
SUT VIC AB 0 CT1 27 (SUTURE) ×3
SUT VIC AB 0 CT1 27XBRD ANBCTR (SUTURE) IMPLANT
SUT VIC AB 0 CT1 8-18 (SUTURE)
SUT VIC AB 2-0 SH 18 (SUTURE) IMPLANT
SUT VICRYL 3-0 CR8 SH (SUTURE) ×3 IMPLANT
SUTURE 2 FIBERLOOP 20 STRT BLU (SUTURE) ×1 IMPLANT
SUTURE FIBERWR #2 38 T-5 BLUE (SUTURE) ×1 IMPLANT
SYR BULB 3OZ (MISCELLANEOUS) ×3 IMPLANT
TAPE FIBER 2MM 7IN #2 BLUE (SUTURE) IMPLANT
TOWEL OR 17X24 6PK STRL BLUE (TOWEL DISPOSABLE) ×3 IMPLANT
TOWEL OR NON WOVEN STRL DISP B (DISPOSABLE) ×6 IMPLANT
WASHER CLAVICLE (Washer) ×2 IMPLANT
YANKAUER SUCT BULB TIP NO VENT (SUCTIONS) ×3 IMPLANT

## 2016-03-08 NOTE — Anesthesia Preprocedure Evaluation (Signed)
Anesthesia Evaluation  Patient identified by MRN, date of birth, ID band Patient awake    Reviewed: Allergy & Precautions, NPO status , Patient's Chart, lab work & pertinent test results  Airway Mallampati: II  TM Distance: >3 FB Neck ROM: Full    Dental no notable dental hx.    Pulmonary Current Smoker,    Pulmonary exam normal breath sounds clear to auscultation       Cardiovascular hypertension, Pt. on medications Normal cardiovascular exam Rhythm:Regular Rate:Normal     Neuro/Psych negative neurological ROS  negative psych ROS   GI/Hepatic Neg liver ROS, gastroparesis   Endo/Other  diabetes, Insulin Dependent  Renal/GU negative Renal ROS  negative genitourinary   Musculoskeletal negative musculoskeletal ROS (+)   Abdominal   Peds negative pediatric ROS (+)  Hematology negative hematology ROS (+)   Anesthesia Other Findings   Reproductive/Obstetrics negative OB ROS                            Anesthesia Physical Anesthesia Plan  ASA: II  Anesthesia Plan: General   Post-op Pain Management: GA combined w/ Regional for post-op pain   Induction: Intravenous  Airway Management Planned: Oral ETT  Additional Equipment:   Intra-op Plan:   Post-operative Plan: Extubation in OR  Informed Consent: I have reviewed the patients History and Physical, chart, labs and discussed the procedure including the risks, benefits and alternatives for the proposed anesthesia with the patient or authorized representative who has indicated his/her understanding and acceptance.   Dental advisory given  Plan Discussed with: CRNA  Anesthesia Plan Comments:         Anesthesia Quick Evaluation

## 2016-03-08 NOTE — Progress Notes (Signed)
Assisted Dr. Carignan with right, ultrasound guided, supraclavicular block. Side rails up, monitors on throughout procedure. See vital signs in flow sheet. Tolerated Procedure well. 
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
## 2016-03-08 NOTE — Anesthesia Postprocedure Evaluation (Signed)
Anesthesia Post Note  Patient: Christopher Mercado  Procedure(s) Performed: Procedure(s) (LRB): RIGHT SHOULDER ACROMIOCLAVICULAR LIGAMENT REPAIR (Right)  Patient location during evaluation: PACU Anesthesia Type: General and Regional Level of consciousness: awake, awake and alert and oriented Pain management: pain level controlled Vital Signs Assessment: post-procedure vital signs reviewed and stable Respiratory status: spontaneous breathing, nonlabored ventilation and respiratory function stable Cardiovascular status: blood pressure returned to baseline Anesthetic complications: no    Last Vitals:  Vitals:   03/08/16 1745 03/08/16 1800  BP: 123/87 121/86  Pulse: 81 74  Resp: 19 15  Temp:      Last Pain:  Vitals:   03/08/16 1800  TempSrc:   PainSc: 8                  Lujuana Kapler COKER

## 2016-03-08 NOTE — Discharge Instructions (Signed)
Diet: As you were doing prior to hospitalization   Shower:  May shower but keep the wounds dry, use an occlusive plastic wrap, NO SOAKING IN TUB.  If the bandage gets wet, change with a clean dry gauze.  If you have a splint on, leave the splint in place and keep the splint dry with a plastic bag.  Dressing:  You may change your dressing 3-5 days after surgery, unless you have a splint.  If you have a splint, then just leave the splint in place and we will change your bandages during your first follow-up appointment.    If you had hand or foot surgery, we will plan to remove your stitches in about 2 weeks in the office.  For all other surgeries, there are sticky tapes (steri-strips) on your wounds and all the stitches are absorbable.  Leave the steri-strips in place when changing your dressings, they will peel off with time, usually 2-3 weeks.  Activity:  Increase activity slowly as tolerated, but follow the weight bearing instructions below.  The rules on driving is that you can not be taking narcotics while you drive, and you must feel in control of the vehicle.    Weight Bearing:   Sling at all times..    Regional Anesthesia Blocks  1. Numbness or the inability to move the "blocked" extremity may last from 3-48 hours after placement. The length of time depends on the medication injected and your individual response to the medication. If the numbness is not going away after 48 hours, call your surgeon.  2. The extremity that is blocked will need to be protected until the numbness is gone and the  Strength has returned. Because you cannot feel it, you will need to take extra care to avoid injury. Because it may be weak, you may have difficulty moving it or using it. You may not know what position it is in without looking at it while the block is in effect.  3. For blocks in the legs and feet, returning to weight bearing and walking needs to be done carefully. You will need to wait until the  numbness is entirely gone and the strength has returned. You should be able to move your leg and foot normally before you try and bear weight or walk. You will need someone to be with you when you first try to ensure you do not fall and possibly risk injury.  4. Bruising and tenderness at the needle site are common side effects and will resolve in a few days.  5. Persistent numbness or new problems with movement should be communicated to the surgeon or the [facility] [phone])/ [facility]  [phone]).  Post Anesthesia Home Care Instructions  Activity: Get plenty of rest for the remainder of the day. A responsible adult should stay with you for 24 hours following the procedure.  For the next 24 hours, DO NOT: -Drive a car -Advertising copywriterperate machinery -Drink alcoholic beverages -Take any medication unless instructed by your physician -Make any legal decisions or sign important papers.  Meals: Start with liquid foods such as gelatin or soup. Progress to regular foods as tolerated. Avoid greasy, spicy, heavy foods. If nausea and/or vomiting occur, drink only clear liquids until the nausea and/or vomiting subsides. Call your physician if vomiting continues.  Special Instructions/Symptoms: Your throat may feel dry or sore from the anesthesia or the breathing tube placed in your throat during surgery. If this causes discomfort, gargle with warm  salt water. The discomfort should disappear within 24 hours.  If you had a scopolamine patch placed behind your ear for the management of post- operative nausea and/or vomiting:  1. The medication in the patch is effective for 72 hours, after which it should be removed.  Wrap patch in a tissue and discard in the trash. Wash hands thoroughly with soap and water. 2. You may remove the patch earlier than 72 hours if you experience unpleasant side effects which may include dry mouth, dizziness or visual disturbances. 3. Avoid touching  the patch. Wash your hands with soap and water after contact with the patch.     To prevent constipation: you may use a stool softener such as -  Colace (over the counter) 100 mg by mouth twice a day  Drink plenty of fluids (prune juice may be helpful) and high fiber foods Miralax (over the counter) for constipation as needed.    Itching:  If you experience itching with your medications, try taking only a single pain pill, or even half a pain pill at a time.  You may take up to 10 pain pills per day, and you can also use benadryl over the counter for itching or also to help with sleep.   Precautions:  If you experience chest pain or shortness of breath - call 911 immediately for transfer to the hospital emergency department!!  If you develop a fever greater that 101 F, purulent drainage from wound, increased redness or drainage from wound, or calf pain -- Call the office at [phone]                                                Follow- Up Appointment:  Please call for an appointment to be seen in 2 weeks [place] - [phone]
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
## 2016-03-08 NOTE — Anesthesia Procedure Notes (Signed)
Anesthesia Regional Block:  Supraclavicular block  Pre-Anesthetic Checklist: ,, timeout performed, Correct Patient, Correct Site, Correct Laterality, Correct Procedure, Correct Position, site marked, Risks and benefits discussed,  Surgical consent,  Pre-op evaluation,  At surgeon's request and post-op pain management  Laterality: Right and Upper  Prep: Maximum Sterile Barrier Precautions used, chloraprep       Needles:  Injection technique: Single-shot  Needle Type: Echogenic Stimulator Needle     Needle Length: 10cm 10 cm Needle Gauge: 21 G    Additional Needles:  Procedures: ultrasound guided (picture in chart) Supraclavicular block Narrative:  Injection made incrementally with aspirations every 5 mL.  Performed by: Personally   Additional Notes: Risks, benefits and alternative to block explained extensively.  Patient tolerated procedure well, without complications.      

## 2016-03-08 NOTE — Anesthesia Procedure Notes (Signed)
Procedure Name: Intubation Date/Time: 03/08/2016 2:57 PM Performed by: Gar GibbonKEETON, Christopher Mercado Pre-anesthesia Checklist: Patient identified, Emergency Drugs available, Suction available and Patient being monitored Patient Re-evaluated:Patient Re-evaluated prior to inductionOxygen Delivery Method: Circle system utilized Preoxygenation: Pre-oxygenation with 100% oxygen Intubation Type: IV induction Ventilation: Mask ventilation without difficulty Laryngoscope Size: Glidescope Grade View: Grade III Tube type: Oral Tube size: 8.0 mm Number of attempts: 3 Airway Equipment and Method: Stylet and Oral airway Placement Confirmation: ETT inserted through vocal cords under direct vision,  positive ETCO2 and breath sounds checked- equal and bilateral Secured at: 22 cm Tube secured with: Tape Dental Injury: Teeth and Oropharynx as per pre-operative assessment  Difficulty Due To: Difficulty was anticipated, Difficult Airway- due to large tongue and Difficult Airway- due to limited oral opening Future Recommendations: Recommend- induction with short-acting agent, and alternative techniques readily available

## 2016-03-08 NOTE — Transfer of Care (Signed)
Immediate Anesthesia Transfer of Care Note  Patient: Christopher Mercado  Procedure(s) Performed: Procedure(s): RIGHT SHOULDER ACROMIOCLAVICULAR LIGAMENT REPAIR (Right)  Patient Location: PACU  Anesthesia Type:GA combined with regional for post-op pain  Level of Consciousness: awake, pateint uncooperative and confused  Airway & Oxygen Therapy: Patient Spontanous Breathing and Patient connected to face mask oxygen  Post-op Assessment: Report given to RN and Post -op Vital signs reviewed and stable  Post vital signs: Reviewed and stable  Last Vitals:  Vitals:   03/08/16 1420 03/08/16 1421  BP: 130/83   Pulse: 78 78  Resp: 16 15  Temp:      Last Pain:  Vitals:   03/08/16 1241  TempSrc: Oral  PainSc: 6       Patients Stated Pain Goal: 2 (03/08/16 1241)  Complications: No apparent anesthesia complications

## 2016-03-09 ENCOUNTER — Encounter (HOSPITAL_BASED_OUTPATIENT_CLINIC_OR_DEPARTMENT_OTHER): Payer: Self-pay | Admitting: Orthopedic Surgery

## 2016-03-09 MED FILL — BACLOFEN 10 MG TABLET: 10 | 13 days supply | Qty: 50 | Fill #0

## 2016-03-09 MED FILL — ONDANSETRON HCL 4 MG TABLET: 4 | 10 days supply | Qty: 30 | Fill #0

## 2016-03-09 MED FILL — ZOLPIDEM TARTRATE 5 MG TAB: 5 | 5 days supply | Qty: 10 | Fill #0

## 2016-03-11 ENCOUNTER — Telehealth: Payer: Self-pay | Admitting: Family

## 2016-03-11 ENCOUNTER — Other Ambulatory Visit: Payer: Self-pay | Admitting: Family

## 2016-03-11 DIAGNOSIS — E119 Type 2 diabetes mellitus without complications: Secondary | ICD-10-CM

## 2016-03-11 MED ORDER — INSULIN PEN NEEDLE 32G X 4 MM MISC: 0 refills | Status: DC

## 2016-03-11 MED FILL — UNIFINE PENTIPS 32GX5/32": 32G X 4 MM | 25 days supply | Qty: 100 | Fill #0

## 2016-03-11 MED FILL — UNIFINE PENTIPS 32GX5/32: 32G X 4 MM | 25 days supply | Qty: 100 | Fill #0

## 2016-03-11 NOTE — Telephone Encounter (Addendum)
Pt blood sugars are also running high. [phone] yesterday was 290. She asked for you to call his cell phone number to speak to him. Thanks.

## 2016-03-11 NOTE — Telephone Encounter (Signed)
Patient Name: ANDY Baucum  DOB: 06/17/1976    Initial Comment Husband takes metformin twice a day and takes insulin. Just had surgery. NeedsRanda Spike to speak to Dr Carver Filaalone about blood sugar that is high around 268. Husband gets really sleepy, dozes off, doesn't know what's going on at that point   Nurse Assessment  Nurse: Sherilyn CooterHenry, RN, Thurmond ButtsWade Date/Time Lamount Cohen(Eastern Time): 03/11/2016 1:00:18 PM  Confirm and document reason for call. If symptomatic, describe symptoms. You must click the next button to save text entered. ---Caller states that her husband had shoulder surgery on Tuesday. He takes Metformin 500mg  BID. He uses Guinea-Bissauresiba insulin only if he gets a reading higher than 200. His blood sugar reading was 268 this morning. Last night, it was 240 something. He had lunch about 15 minutes ago. His current reading is 302 after having a sandwich on wheat bread. Denies vomiting.  Has the patient traveled out of the country within the last 30 days? ---No  Does the patient have any new or worsening symptoms? ---Yes  Will a triage be completed? ---Yes  Related visit to physician within the last 2 weeks? ---No  Does the PT have any chronic conditions? (i.e. diabetes, asthma, etc.) ---Yes  List chronic conditions. ---Diabetes Type II  Is this a behavioral health or substance abuse call? ---No     Guidelines    Guideline Title Affirmed Question Affirmed Notes  Diabetes - High Blood Sugar [1] Blood glucose > 240 mg/dl (13 mmol/l) AND [4][2] does not use insulin (e.g., not insulin-dependent; most type 2 diabetics) (all triage questions negative)    Final Disposition User   Home Care Sherilyn CooterHenry, RN, Thurmond ButtsWade    Comments  >300 but 15 minutes after lunch.  Caller states that her husband is on the phone with Dr. Carver Filaalone now. Advised her that Dr. Carver Filaalone will decide if he needs to be seen or not. She verbalized understanding.   Referrals  REFERRED TO PCP OFFICE   Disagree/Comply: Comply

## 2016-03-11 NOTE — Telephone Encounter (Signed)
States patient had surgery recently.  Has questions about current medications.

## 2016-03-11 NOTE — Telephone Encounter (Signed)
Spoke with patient. Indicates that he has only taken the Guinea-Bissauresiba 2x since his office visit. Notes blood sugars to be elevated which is anticipated post surgery. Increase metformin to 1,000 mg bid and Tresiba to 20 units daily. Continue to monitor blood sugars and follow up in 1 week.

## 2016-03-11 NOTE — Telephone Encounter (Signed)
PCP aware per notes from [facility]. Closing phone note.

## 2016-03-16 DIAGNOSIS — S43101D Unspecified dislocation of right acromioclavicular joint, subsequent encounter: Secondary | ICD-10-CM | POA: Diagnosis not present

## 2016-03-30 DIAGNOSIS — S43101D Unspecified dislocation of right acromioclavicular joint, subsequent encounter: Secondary | ICD-10-CM | POA: Diagnosis not present

## 2016-04-06 ENCOUNTER — Ambulatory Visit: Payer: 59 | Admitting: Family

## 2016-04-06 ENCOUNTER — Telehealth: Payer: Self-pay | Admitting: Family

## 2016-04-06 DIAGNOSIS — M25611 Stiffness of right shoulder, not elsewhere classified: Secondary | ICD-10-CM | POA: Diagnosis not present

## 2016-04-06 DIAGNOSIS — S43101D Unspecified dislocation of right acromioclavicular joint, subsequent encounter: Secondary | ICD-10-CM | POA: Diagnosis not present

## 2016-04-06 DIAGNOSIS — M25511 Pain in right shoulder: Secondary | ICD-10-CM | POA: Diagnosis not present

## 2016-04-06 DIAGNOSIS — M6281 Muscle weakness (generalized): Secondary | ICD-10-CM | POA: Diagnosis not present

## 2016-04-06 NOTE — Telephone Encounter (Signed)
Patient no showed for 6 month fu on 9/27.  Please advise.

## 2016-04-07 NOTE — Telephone Encounter (Signed)
Ok to reschedule if he calls back 

## 2016-04-13 DIAGNOSIS — M25611 Stiffness of right shoulder, not elsewhere classified: Secondary | ICD-10-CM | POA: Diagnosis not present

## 2016-04-13 DIAGNOSIS — M6281 Muscle weakness (generalized): Secondary | ICD-10-CM | POA: Diagnosis not present

## 2016-04-13 DIAGNOSIS — S43101D Unspecified dislocation of right acromioclavicular joint, subsequent encounter: Secondary | ICD-10-CM | POA: Diagnosis not present

## 2016-04-13 DIAGNOSIS — M25511 Pain in right shoulder: Secondary | ICD-10-CM | POA: Diagnosis not present

## 2016-05-11 DIAGNOSIS — M25511 Pain in right shoulder: Secondary | ICD-10-CM | POA: Diagnosis not present

## 2016-06-08 DIAGNOSIS — M25511 Pain in right shoulder: Secondary | ICD-10-CM | POA: Diagnosis not present

## 2016-08-08 ENCOUNTER — Other Ambulatory Visit (INDEPENDENT_AMBULATORY_CARE_PROVIDER_SITE_OTHER): Payer: 59

## 2016-08-08 ENCOUNTER — Ambulatory Visit (INDEPENDENT_AMBULATORY_CARE_PROVIDER_SITE_OTHER): Payer: 59 | Admitting: Internal Medicine

## 2016-08-08 ENCOUNTER — Encounter: Payer: Self-pay | Admitting: Internal Medicine

## 2016-08-08 VITALS — BP 130/80 | HR 91 | Temp 98.6°F | Resp 16 | Ht 69.0 in | Wt 178.0 lb

## 2016-08-08 DIAGNOSIS — E1165 Type 2 diabetes mellitus with hyperglycemia: Secondary | ICD-10-CM

## 2016-08-08 DIAGNOSIS — J40 Bronchitis, not specified as acute or chronic: Secondary | ICD-10-CM

## 2016-08-08 DIAGNOSIS — E118 Type 2 diabetes mellitus with unspecified complications: Secondary | ICD-10-CM | POA: Diagnosis not present

## 2016-08-08 DIAGNOSIS — IMO0002 Reserved for concepts with insufficient information to code with codable children: Secondary | ICD-10-CM

## 2016-08-08 LAB — COMPREHENSIVE METABOLIC PANEL
ALBUMIN: 4.2 g/dL (ref 3.5–5.2)
ALK PHOS: 68 U/L (ref 39–117)
ALT: 22 U/L (ref 0–53)
AST: 15 U/L (ref 0–37)
BUN: 12 mg/dL (ref 6–23)
CALCIUM: 9.2 mg/dL (ref 8.4–10.5)
CHLORIDE: 92 meq/L — AB (ref 96–112)
CO2: 29 mEq/L (ref 19–32)
CREATININE: 0.83 mg/dL (ref 0.40–1.50)
GFR: 108.93 mL/min (ref >60.00)
Glucose, Bld: 345 mg/dL — ABNORMAL HIGH (ref 70–99)
Potassium: 4 mEq/L (ref 3.5–5.1)
SODIUM: 128 meq/L — AB (ref 135–145)
TOTAL PROTEIN: 7.6 g/dL (ref 6.0–8.3)
Total Bilirubin: 0.4 mg/dL (ref 0.2–1.2)

## 2016-08-08 LAB — CBC
HEMATOCRIT: 45.9 % (ref 39.0–52.0)
Hemoglobin: 16.3 g/dL (ref 13.0–17.0)
MCHC: 35.6 g/dL (ref 30.0–36.0)
MCV: 87.8 fl (ref 78.0–100.0)
PLATELETS: 214 10*3/uL (ref 150.0–400.0)
RBC: 5.23 Mil/uL (ref 4.22–5.81)
RDW: 12 % (ref 11.5–15.5)
WBC: 5.8 10*3/uL (ref 4.0–10.5)

## 2016-08-08 LAB — HEMOGLOBIN A1C: HEMOGLOBIN A1C: 10.6 % — AB (ref 4.6–6.5)

## 2016-08-08 MED ORDER — FLUTICASONE PROPIONATE 50 MCG/ACT NA SUSP: 2.0000 | Freq: Every day | NASAL | 6 refills | Status: DC

## 2016-08-08 MED ORDER — DOXYCYCLINE HYCLATE 100 MG PO TABS: 100.0000 mg | ORAL_TABLET | Freq: Two times a day (BID) | ORAL | 0 refills | Status: DC

## 2016-08-08 MED FILL — FLUTICASONE PROP 50 MCG SPR: 50 | 30 days supply | Qty: 16 | Fill #0

## 2016-08-08 MED FILL — DOXYCYCLINE HYCLATE 100 MG: 100 | 7 days supply | Qty: 14 | Fill #0

## 2016-08-08 NOTE — Progress Notes (Signed)
Pre visit review using our clinic review tool, if applicable. No additional management support is needed unless otherwise documented below in the visit note. 

## 2016-08-08 NOTE — Patient Instructions (Addendum)
We have sent in the doxycycline to take. Take 1 pill twice a day for 1 week.   We have also sent in flonase nose spray for the sinuses. Use 2 sprays in each nostril once a day.    Upper Respiratory Infection, Adult Most upper respiratory infections (URIs) are a viral infection of the air passages leading to the lungs. A URI affects the nose, throat, and upper air passages. The most common type of URI is nasopharyngitis and is typically referred to as "the common cold." URIs run their course and usually go away on their own. Most of the time, a URI does not require medical attention, but sometimes a bacterial infection in the upper airways can follow a viral infection. This is called a secondary infection. Sinus and middle ear infections are common types of secondary upper respiratory infections. Bacterial pneumonia can also complicate a URI. A URI can worsen asthma and chronic obstructive pulmonary disease (COPD). Sometimes, these complications can require emergency medical care and may be life threatening. What are the causes? Almost all URIs are caused by viruses. A virus is a type of germ and can spread from one person to another. What increases the risk? You may be at risk for a URI if:  You smoke.  You have chronic heart or lung disease.  You have a weakened defense (immune) system.  You are very young or very old.  You have nasal allergies or asthma.  You work in crowded or poorly ventilated areas.  You work in health care facilities or schools. What are the signs or symptoms? Symptoms typically develop 2-3 days after you come in contact with a cold virus. Most viral URIs last 7-10 days. However, viral URIs from the influenza virus (flu virus) can last 14-18 days and are typically more severe. Symptoms may include:  Runny or stuffy (congested) nose.  Sneezing.  Cough.  Sore throat.  Headache.  Fatigue.  Fever.  Loss of appetite.  Pain in your forehead, behind your  eyes, and over your cheekbones (sinus pain).  Muscle aches. How is this diagnosed? Your health care provider may diagnose a URI by:  Physical exam.  Tests to check that your symptoms are not due to another condition such as:  Strep throat.  Sinusitis.  Pneumonia.  Asthma. How is this treated? A URI goes away on its own with time. It cannot be cured with medicines, but medicines may be prescribed or recommended to relieve symptoms. Medicines may help:  Reduce your fever.  Reduce your cough.  Relieve nasal congestion. Follow these instructions at home:  Take medicines only as directed by your health care provider.  Gargle warm saltwater or take cough drops to comfort your throat as directed by your health care provider.  Use a warm mist humidifier or inhale steam from a shower to increase air moisture. This may make it easier to breathe.  Drink enough fluid to keep your urine clear or pale yellow.  Eat soups and other clear broths and maintain good nutrition.  Rest as needed.  Return to work when your temperature has returned to normal or as your health care provider advises. You may need to stay home longer to avoid infecting others. You can also use a face mask and careful hand washing to prevent spread of the virus.  Increase the usage of your inhaler if you have asthma.  Do not use any tobacco products, including cigarettes, chewing tobacco, or electronic cigarettes. If you need help quitting,  ask your health care provider. How is this prevented? The best way to protect yourself from getting a cold is to practice good hygiene.  Avoid oral or hand contact with people with cold symptoms.  Wash your hands often if contact occurs. There is no clear evidence that vitamin C, vitamin E, echinacea, or exercise reduces the chance of developing a cold. However, it is always recommended to get plenty of rest, exercise, and practice good nutrition. Contact a health care  provider if:  You are getting worse rather than better.  Your symptoms are not controlled by medicine.  You have chills.  You have worsening shortness of breath.  You have brown or red mucus.  You have yellow or brown nasal discharge.  You have pain in your face, especially when you bend forward.  You have a fever.  You have swollen neck glands.  You have pain while swallowing.  You have white areas in the back of your throat. Get help right away if:  You have severe or persistent:  Headache.  Ear pain.  Sinus pain.  Chest pain.  You have chronic lung disease and any of the following:  Wheezing.  Prolonged cough.  Coughing up blood.  A change in your usual mucus.  You have a stiff neck.  You have changes in your:  Vision.  Hearing.  Thinking.  Mood. This information is not intended to replace advice given to you by your health care provider. Make sure you discuss any questions you have with your health care provider. Document Released: 12/21/2000 Document Revised: 02/28/2016 Document Reviewed: 10/02/2013 Elsevier Interactive Patient Education  2017 [employer]

## 2016-08-08 NOTE — Progress Notes (Signed)
   Subjective:    Patient ID: Christopher Mercado, male    DOB: 12/09/1975, 41 y.o.   MRN: [id number]  HPI The patient is a 41 YO man coming in for cough and sinus problems. He has had these symptoms for about 1-2 weeks at this time. He is not taking anything over the counter. Was around sick contacts. No fevers but some chills. Mild SOB with activity. Cough which is productive. Overall symptoms are worsening over the last 1-2 weeks.   Review of Systems  Constitutional: Positive for activity change, chills and fatigue. Negative for appetite change, fever and unexpected weight change.  HENT: Positive for congestion, postnasal drip and rhinorrhea. Negative for ear discharge, ear pain, sinus pain, sinus pressure, sore throat and trouble swallowing.   Eyes: Negative.   Respiratory: Positive for cough and shortness of breath. Negative for chest tightness and wheezing.   Cardiovascular: Negative.   Gastrointestinal: Negative.   Musculoskeletal: Negative.   Neurological: Positive for numbness.      Objective:   Physical Exam  Constitutional: He is oriented to person, place, and time. He appears well-developed and well-nourished.  Overweight  HENT:  Head: Normocephalic and atraumatic.  Oropharynx redness with clear drainage, nose without crusting, no sinus pressure.   Eyes: EOM are normal.  Neck: Normal range of motion. No JVD present.  Cardiovascular: Normal rate and regular rhythm.   Pulmonary/Chest: Effort normal. No respiratory distress. He has no wheezes.  Some coarse rhonchi which do not clear with cough.   Abdominal: Soft.  Lymphadenopathy:    He has no cervical adenopathy.  Neurological: He is alert and oriented to person, place, and time.  Skin: Skin is warm and dry.   Vitals:   08/08/16 1503  BP: 130/80  Pulse: 91  Resp: 16  Temp: 98.6 F (37 C)  TempSrc: Oral  SpO2: 98%  Weight: 178 lb (80.7 kg)  Height: 5\' 9"  (1.753 m)      Assessment & Plan:

## 2016-08-12 DIAGNOSIS — J40 Bronchitis, not specified as acute or chronic: Secondary | ICD-10-CM | POA: Insufficient documentation

## 2016-08-12 NOTE — Assessment & Plan Note (Signed)
No labs in some time and not taking his meds right now. Checking labs while he is here with HgA1c, CBC, CMP.

## 2016-08-12 NOTE — Assessment & Plan Note (Signed)
Rx for doxycyline for lung changes and extended course.

## 2016-10-19 ENCOUNTER — Other Ambulatory Visit: Payer: Self-pay | Admitting: *Deleted

## 2016-10-19 DIAGNOSIS — F419 Anxiety disorder, unspecified: Secondary | ICD-10-CM

## 2016-10-19 NOTE — Telephone Encounter (Signed)
Wife left msg on triage requesting refill on husband alprazolam../lmb

## 2016-10-21 MED ORDER — ALPRAZOLAM 0.25 MG PO TABS: 0.2500 mg | ORAL_TABLET | Freq: Two times a day (BID) | ORAL | 0 refills | Status: DC | PRN

## 2016-10-21 NOTE — Telephone Encounter (Signed)
Printed and pt has made an appt.

## 2016-10-21 NOTE — Addendum Note (Signed)
Addended by: Verlan Friends on: 10/21/2016 02:33 PM   Modules accepted: Orders

## 2016-10-21 NOTE — Telephone Encounter (Signed)
Medication refilled. Will need OV for additional refills.

## 2016-10-21 NOTE — Telephone Encounter (Signed)
rx has been faxed to [facility]

## 2016-10-31 ENCOUNTER — Ambulatory Visit: Payer: 59 | Admitting: Family

## 2016-11-01 ENCOUNTER — Ambulatory Visit: Payer: 59 | Admitting: Family

## 2016-11-03 ENCOUNTER — Ambulatory Visit (HOSPITAL_COMMUNITY)
Admission: EM | Admit: 2016-11-03 | Discharge: 2016-11-03 | Disposition: A | Discharge status: Home / Self Care | Payer: 59 | Attending: Family Medicine | Admitting: Family Medicine

## 2016-11-03 ENCOUNTER — Encounter (HOSPITAL_COMMUNITY): Payer: Self-pay | Admitting: Emergency Medicine

## 2016-11-03 DIAGNOSIS — H6592 Unspecified nonsuppurative otitis media, left ear: Secondary | ICD-10-CM | POA: Diagnosis not present

## 2016-11-03 MED ORDER — MONTELUKAST SODIUM 10 MG PO TABS: 10.0000 mg | ORAL_TABLET | Freq: Every day | ORAL | 2 refills | Status: DC

## 2016-11-03 MED FILL — MONTELUKAST SOD 10 MG TAB: 10 | 30 days supply | Qty: 30 | Fill #0

## 2016-11-03 NOTE — Discharge Instructions (Signed)
You have an effusion in your left ear. This is not an infection, and antibiotics do not help in this condition. This is most likely related to allergies causing a buildup of fluid in your ear. I recommend taking an over-the-counter antihistamine such as Claritin, Allegra, Zyrtec, or Xyzal every day. I prescribed a medicine called Singulair, take one tablet at night every night. I also recommend over-the-counter inhale Nazar steroid such as Flonase or budesonide  sprays each nostril every day. If your pain persists, follow-up with primary care or return to clinic as needed

## 2016-11-03 NOTE — ED Triage Notes (Signed)
PT reports left ear pain and fullness that hurts down jaw for 1 week

## 2016-11-03 NOTE — ED Provider Notes (Signed)
CSN: [id number]     Arrival date & time 11/03/16  1230 History   First MD Initiated Contact with Patient 11/03/16 1308     Chief Complaint  Patient presents with  . Ear Fullness   (Consider location/radiation/quality/duration/timing/severity/associated sxs/prior Treatment) 2-4 days of ear pain, congestion, and fullness   The history is provided by the patient.  Ear Fullness  This is a new problem. The current episode started 2 days ago. The problem occurs constantly. The problem has been gradually worsening. Pertinent negatives include no chest pain, no abdominal pain, no headaches and no shortness of breath. Nothing aggravates the symptoms. Nothing relieves the symptoms. He has tried nothing for the symptoms. The treatment provided no relief.    Past Medical History:  Diagnosis Date  . 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 Diabetes mellitus without complication ([place])   . 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 Elevated LFTs   . Esophageal reflux   . Gastritis   . Gastroparesis    Seen by GI once - has "lived with it"  . Hypertension   . Meningitis, unspecified(322.9)    34-85 years of age  . Polyuria    Past Surgical History:  Procedure Laterality Date  . ACROMIO-CLAVICULAR JOINT REPAIR Right 03/08/2016   Procedure: RIGHT SHOULDER ACROMIOCLAVICULAR LIGAMENT REPAIR;  Surgeon: Marchia Bond, MD;  Location: [place];  Service: Orthopedics;  Laterality: Right;  . HERNIA REPAIR     71-14 years of age  . WISDOM TOOTH EXTRACTION     Family History  Problem Relation Age of Onset  . Prostate cancer Father   . Cancer Father   . Heart attack Father   . Heart disease Father   . Hypertension Mother   . CAD Mother   . Heart disease Mother   . Hyperlipidemia Maternal Grandfather   . Hypertension Maternal Grandfather   . Colon cancer      uncle  . Heart disease    . Liver disease     Social  History  Substance Use Topics  . Smoking status: Current Some Day Smoker    Packs/day: 0.50    Types: Cigarettes  . Smokeless tobacco: Current User    Types: Snuff  . Alcohol use Yes     Comment: occassionally    Review of Systems  Constitutional: Negative.   HENT: Positive for congestion and ear pain. Negative for ear discharge, rhinorrhea, sneezing, sore throat and tinnitus.   Eyes: Positive for itching. Negative for redness.  Respiratory: Negative for cough and shortness of breath.   Cardiovascular: Negative for chest pain and palpitations.  Gastrointestinal: Negative for abdominal pain, diarrhea, nausea and vomiting.  Musculoskeletal: Negative.   Skin: Negative.   Neurological: Negative for dizziness, numbness and headaches.    Allergies  Prednisone and Coconut fatty acids  Home Medications   Prior to Admission medications   Medication Sig Start Date End Date Taking? Authorizing Provider  ALPRAZolam (XANAX) 0.25 MG tablet Take 1 tablet (0.25 mg total) by mouth 2 (two) times daily as needed for anxiety or sleep. 10/21/16   Golden Circle, FNP  amLODipine (NORVASC) 2.5 MG tablet Take 2.5 mg by mouth daily.    Historical Provider, MD  baclofen (LIORESAL) 10 MG tablet Take 1 tablet (10 mg total) by mouth 3 (three) times daily. As needed for muscle spasm Patient not taking: Reported on 08/08/2016 03/08/16   Marchia Bond, MD  Blood Glucose  Monitoring Suppl ([place]) w/Device KIT 1 Device by Does not apply route as directed. Use the device 1-4 times daily to check your blood sugars as instructed. Patient not taking: Reported on 08/08/2016 12/24/15   Golden Circle, FNP  doxycycline (VIBRA-TABS) 100 MG tablet Take 1 tablet (100 mg total) by mouth 2 (two) times daily. 08/08/16   Hoyt Koch, MD  fluticasone (FLONASE) 50 MCG/ACT nasal spray Place 2 sprays into both nostrils daily. 08/08/16   Hoyt Koch, MD  glucose blood (ONE TOUCH ULTRA TEST) test  strip Use 1 strip per test. Test blood sugars 1-4 times daily as instructed. Patient not taking: Reported on 08/08/2016 12/24/15   Golden Circle, FNP  Insulin Degludec (TRESIBA FLEXTOUCH) 100 UNIT/ML SOPN Inject 10 Units into the skin daily. Patient not taking: Reported on 08/08/2016 03/03/16   Golden Circle, FNP  Insulin Pen Needle (BD PEN NEEDLE NANO U/F) 32G X 4 MM MISC Use 1 needle per test to inject insulin as instructed. Patient not taking: Reported on 08/08/2016 03/11/16   Golden Circle, FNP  Lancets Misc. (UNISTIK 3 COMFORT) MISC 1 Device by Does not apply route daily. Use 1 lancet per check. Test blood sugars 1-4 times daily as instructed. Patient not taking: Reported on 08/08/2016 12/24/15   Golden Circle, FNP  metFORMIN (GLUCOPHAGE) 500 MG tablet Take 1 tablet (500 mg total) by mouth 2 (two) times daily with a meal. Patient not taking: Reported on 08/08/2016 12/17/15   Golden Circle, FNP  montelukast (SINGULAIR) 10 MG tablet Take 1 tablet (10 mg total) by mouth at bedtime. 11/03/16   Barnet Glasgow, NP  Multiple Vitamin (MULTIVITAMIN) capsule Take 1 capsule by mouth daily.    Historical Provider, MD  ondansetron (ZOFRAN) 4 MG tablet Take 1 tablet (4 mg total) by mouth every 8 (eight) hours as needed for nausea or vomiting. Patient not taking: Reported on 08/08/2016 03/08/16   Marchia Bond, MD  oxyCODONE (ROXICODONE) 5 MG immediate release tablet Take 1-2 tablets (5-10 mg total) by mouth every 4 (four) hours as needed for severe pain. Patient not taking: Reported on 08/08/2016 03/08/16   Marchia Bond, MD  potassium chloride (MICRO-K) 10 MEQ CR capsule Take 20 mEq by mouth 2 (two) times daily.    Historical Provider, MD  sennosides-docusate sodium (SENOKOT-S) 8.6-50 MG tablet Take 2 tablets by mouth daily. Patient not taking: Reported on 08/08/2016 03/08/16   Marchia Bond, MD   Meds Ordered and Administered this Visit  Medications - No data to display  BP 139/87 (BP Location: Left  Arm)   Pulse 92   Temp 97.8 F (36.6 C) (Oral)   Resp 16   Ht '5\' 8"'$  (1.727 m)   Wt 185 lb (83.9 kg)   SpO2 100%   BMI 28.13 kg/m  No data found.   Physical Exam  Constitutional: He is oriented to person, place, and time. He appears well-developed and well-nourished. No distress.  HENT:  Head: Normocephalic and atraumatic.  Right Ear: External ear normal.  Left Ear: External ear normal.  Left ear effusion and bulging tympanic membrane, boggy nasal turbinates with rhinorrhea and clear discharge  Eyes: Conjunctivae are normal. Right eye exhibits no discharge. Left eye exhibits no discharge.  Cardiovascular: Normal rate and regular rhythm.   Pulmonary/Chest: Effort normal and breath sounds normal.  Neurological: He is alert and oriented to person, place, and time.  Skin: Skin is warm and dry. Capillary refill takes  less than 2 seconds. He is not diaphoretic.  Psychiatric: He has a normal mood and affect. His behavior is normal.  Nursing note and vitals reviewed.   Urgent Care Course     Procedures (including critical care time)  Labs Review Labs Reviewed - No data to display  Imaging Review No results found.      MDM   1. Left otitis media with effusion     Symptoms most likely related to allergic rhinitis. Givne singulair, advised Zyrtec and Flonase daily, follow up with PCP or return to clinic as needed.     Barnet Glasgow, NP 11/03/16 1433

## 2016-12-12 DIAGNOSIS — M25511 Pain in right shoulder: Secondary | ICD-10-CM | POA: Diagnosis not present

## 2016-12-26 ENCOUNTER — Encounter: Payer: Self-pay | Admitting: *Deleted

## 2016-12-26 ENCOUNTER — Emergency Department
Admission: EM | Admit: 2016-12-26 | Discharge: 2016-12-26 | Disposition: A | Discharge status: Home / Self Care | Payer: 59 | Attending: Emergency Medicine | Admitting: Emergency Medicine

## 2016-12-26 DIAGNOSIS — F17210 Nicotine dependence, cigarettes, uncomplicated: Secondary | ICD-10-CM | POA: Insufficient documentation

## 2016-12-26 DIAGNOSIS — Z79899 Other long term (current) drug therapy: Secondary | ICD-10-CM | POA: Insufficient documentation

## 2016-12-26 DIAGNOSIS — E1165 Type 2 diabetes mellitus with hyperglycemia: Secondary | ICD-10-CM | POA: Diagnosis not present

## 2016-12-26 DIAGNOSIS — E860 Dehydration: Secondary | ICD-10-CM | POA: Insufficient documentation

## 2016-12-26 DIAGNOSIS — R55 Syncope and collapse: Secondary | ICD-10-CM | POA: Diagnosis not present

## 2016-12-26 DIAGNOSIS — I10 Essential (primary) hypertension: Secondary | ICD-10-CM | POA: Diagnosis not present

## 2016-12-26 DIAGNOSIS — R739 Hyperglycemia, unspecified: Secondary | ICD-10-CM

## 2016-12-26 LAB — BASIC METABOLIC PANEL
ANION GAP: 10 (ref 5–15)
BUN: 13 mg/dL (ref 6–20)
CALCIUM: 9.6 mg/dL (ref 8.9–10.3)
CO2: 23 mmol/L (ref 22–32)
CREATININE: 0.79 mg/dL (ref 0.61–1.24)
Chloride: 100 mmol/L — ABNORMAL LOW (ref 101–111)
GFR calc Af Amer: >60 mL/min (ref >60)
GFR calc non Af Amer: >60 mL/min (ref >60)
GLUCOSE: 270 mg/dL — AB (ref 65–99)
Potassium: 3.6 mmol/L (ref 3.5–5.1)
Sodium: 133 mmol/L — ABNORMAL LOW (ref 135–145)

## 2016-12-26 LAB — CBC
HCT: 46.4 % (ref 40.0–52.0)
Hemoglobin: 16.4 g/dL (ref 13.0–18.0)
MCH: 31.2 pg (ref 26.0–34.0)
MCHC: 35.3 g/dL (ref 32.0–36.0)
MCV: 88.5 fL (ref 80.0–100.0)
PLATELETS: 250 10*3/uL (ref 150–440)
RBC: 5.24 MIL/uL (ref 4.40–5.90)
RDW: 12.3 % (ref 11.5–14.5)
WBC: 12.1 10*3/uL — ABNORMAL HIGH (ref 3.8–10.6)

## 2016-12-26 LAB — GLUCOSE, CAPILLARY: GLUCOSE-CAPILLARY: 260 mg/dL — AB (ref 65–99)

## 2016-12-26 NOTE — Discharge Instructions (Signed)
Results for orders placed or performed during the hospital encounter of 12/26/16  Basic metabolic panel  Result Value Ref Range   Sodium 133 (L) 135 - 145 mmol/L   Potassium 3.6 3.5 - 5.1 mmol/L   Chloride 100 (L) 101 - 111 mmol/L   CO2 23 22 - 32 mmol/L   Glucose, Bld 270 (H) 65 - 99 mg/dL   BUN 13 6 - 20 mg/dL   Creatinine, Ser 1.300.79 0.61 - 1.24 mg/dL   Calcium 9.6 8.9 - 86.510.3 mg/dL   GFR calc non Af Amer >60 >60 mL/min   GFR calc Af Amer >60 >60 mL/min   Anion gap 10 5 - 15  CBC  Result Value Ref Range   WBC 12.1 (H) 3.8 - 10.6 K/uL   RBC 5.24 4.40 - 5.90 MIL/uL   Hemoglobin 16.4 13.0 - 18.0 g/dL   HCT 78.446.4 69.640.0 - 29.552.0 %   MCV 88.5 80.0 - 100.0 fL   MCH 31.2 26.0 - 34.0 pg   MCHC 35.3 32.0 - 36.0 g/dL   RDW 28.412.3 13.211.5 - 44.014.5 %   Platelets 250 150 - 440 K/uL  Glucose, capillary  Result Value Ref Range   Glucose-Capillary 260 (H) 65 - 99 mg/dL   Comment 1 Notify RN    No results found.

## 2016-12-26 NOTE — ED Triage Notes (Signed)
Pt arrives with a complaint of syncope, states he was at work and got irritated and had sudden onset of feeling hot and had a syncopal episode, denies hitting his head, states hx of DM but states he has not taken his meds in mouths because he "was on too much", at present pt awake and alert

## 2016-12-26 NOTE — ED Provider Notes (Signed)
[facility] Emergency Department Provider Note  ____________________________________________  Time seen: Approximately 2:21 PM  I have reviewed the triage vital signs and the nursing notes.   HISTORY  Chief Complaint Loss of Consciousness    HPI Christopher Mercado is a 41 y.o. male brought to the ED due to syncope. 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 He felt like his blood pressure was high and his blood sugar was high and he became diaphoretic and then left. When he returned to [employer], he passed out. He denies any dizziness chest pain shortness of breath back pain and shortness of breath prior to the passing out. No headaches. No new symptoms afterward. He reports he was given a bag of IV fluids by EMS while he was on scene at [facility], and currently feels totally fine and back to normal.  He does report that he is supposed to be on metformin twice a day as well as insulin twice a day but doesn't take any of these medications. He does have an adequate supply of them at home and access to primary care. He reports that he drinks lots of water every day and feels like he must be well-hydrated because of it, but also does note that he urinates frequently. He also drinks multiple sodas a day including a breakfast and throughout the day.     Past Medical History:  Diagnosis Date  . 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 Diabetes mellitus without complication ([place])   . 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 Elevated LFTs   . Esophageal reflux   . Gastritis   . Gastroparesis    Seen by GI once - has "lived with it"  . Hypertension   . Meningitis, unspecified(322.9)    5-18 years of age  . Polyuria      Patient Active Problem List   Diagnosis Date Noted  . Bronchitis 08/12/2016  . Dislocation of right  acromioclavicular joint with greater than 200% displacement 03/08/2016  . Uncontrolled type 2 diabetes mellitus with complication, without long-term current use of insulin ([place]) 12/24/2015  . Numbness and tingling 12/16/2015  . Essential hypertension 08/29/2014  . Overweight(278.02) 09/09/2011  . Abnormal LFTs (liver function tests) 09/09/2011  . RLS (restless legs syndrome) 09/09/2011  . Anxiety 09/09/2011  . GERD (gastroesophageal reflux disease) 03/01/2011  . Nondiabetic gastroparesis 03/01/2011  . ABDOMINAL PAIN 11/06/2008  . ATROPHIC GASTRITIS 11/05/2008  . MENINGITIS 11/04/2008  . NAUSEA AND VOMITING 11/04/2008  . HERNIA, HX OF 11/04/2008     Past Surgical History:  Procedure Laterality Date  . ACROMIO-CLAVICULAR JOINT REPAIR Right 03/08/2016   Procedure: RIGHT SHOULDER ACROMIOCLAVICULAR LIGAMENT REPAIR;  Surgeon: Marchia Bond, MD;  Location: [place];  Service: Orthopedics;  Laterality: Right;  . HERNIA REPAIR     77-79 years of age  . WISDOM TOOTH EXTRACTION       Prior to Admission medications   Medication Sig Start Date End Date Taking? Authorizing Provider  ALPRAZolam (XANAX) 0.25 MG tablet Take 1 tablet (0.25 mg total) by mouth 2 (two) times daily as needed for anxiety or sleep. 10/21/16   Golden Circle, FNP  amLODipine (NORVASC) 2.5 MG tablet Take 2.5 mg by mouth daily.    [provider]  baclofen (LIORESAL) 10 MG tablet Take 1 tablet (10 mg total) by mouth 3 (three)  times daily. As needed for muscle spasm Patient not taking: Reported on 08/08/2016 03/08/16   Marchia Bond, MD  Blood Glucose Monitoring Suppl ([place]) w/Device KIT 1 Device by Does not apply route as directed. Use the device 1-4 times daily to check your blood sugars as instructed. Patient not taking: Reported on 08/08/2016 12/24/15   Golden Circle, FNP  doxycycline (VIBRA-TABS) 100 MG tablet Take 1 tablet (100 mg total) by mouth 2 (two) times daily.  08/08/16   Hoyt Koch, MD  fluticasone Asencion Islam) 50 MCG/ACT nasal spray Place 2 sprays into both nostrils daily. 08/08/16   Hoyt Koch, MD  glucose blood (ONE TOUCH ULTRA TEST) test strip Use 1 strip per test. Test blood sugars 1-4 times daily as instructed. Patient not taking: Reported on 08/08/2016 12/24/15   Golden Circle, FNP  Insulin Degludec (TRESIBA FLEXTOUCH) 100 UNIT/ML SOPN Inject 10 Units into the skin daily. Patient not taking: Reported on 08/08/2016 03/03/16   Golden Circle, FNP  Insulin Pen Needle (BD PEN NEEDLE NANO U/F) 32G X 4 MM MISC Use 1 needle per test to inject insulin as instructed. Patient not taking: Reported on 08/08/2016 03/11/16   Golden Circle, FNP  Lancets Misc. Patricia Pesa 3 COMFORT) MISC 1 Device by Does not apply route daily. Use 1 lancet per check. Test blood sugars 1-4 times daily as instructed. Patient not taking: Reported on 08/08/2016 12/24/15   Golden Circle, FNP  metFORMIN (GLUCOPHAGE) 500 MG tablet Take 1 tablet (500 mg total) by mouth 2 (two) times daily with a meal. Patient not taking: Reported on 08/08/2016 12/17/15   Golden Circle, FNP  montelukast (SINGULAIR) 10 MG tablet Take 1 tablet (10 mg total) by mouth at bedtime. 11/03/16   Barnet Glasgow, NP  Multiple Vitamin (MULTIVITAMIN) capsule Take 1 capsule by mouth daily.    [provider]  ondansetron (ZOFRAN) 4 MG tablet Take 1 tablet (4 mg total) by mouth every 8 (eight) hours as needed for nausea or vomiting. Patient not taking: Reported on 08/08/2016 03/08/16   Marchia Bond, MD  oxyCODONE (ROXICODONE) 5 MG immediate release tablet Take 1-2 tablets (5-10 mg total) by mouth every 4 (four) hours as needed for severe pain. Patient not taking: Reported on 08/08/2016 03/08/16   Marchia Bond, MD  potassium chloride (MICRO-K) 10 MEQ CR capsule Take 20 mEq by mouth 2 (two) times daily.    [provider]  sennosides-docusate sodium (SENOKOT-S) 8.6-50 MG tablet  Take 2 tablets by mouth daily. Patient not taking: Reported on 08/08/2016 03/08/16   Marchia Bond, MD     Allergies Prednisone and Coconut fatty acids   Family History  Problem Relation Age of Onset  . Prostate cancer Father   . Cancer Father   . Heart attack Father   . Heart disease Father   . Hypertension Mother   . CAD Mother   . Heart disease Mother   . Hyperlipidemia Maternal Grandfather   . Hypertension Maternal Grandfather   . Colon cancer Unknown        uncle  . Heart disease Unknown   . Liver disease Unknown     Social History Social History  Substance Use Topics  . Smoking status: Current Some Day Smoker    Packs/day: 0.50    Types: Cigarettes  . Smokeless tobacco: Current User    Types: Snuff  . Alcohol use Yes     Comment: occassionally    Review of  Systems  Constitutional:   No fever or chills.  ENT:   No sore throat. No rhinorrhea. Cardiovascular:   No chest pain or syncope. Respiratory:   No dyspnea or cough. Gastrointestinal:   Negative for abdominal pain, vomiting and diarrhea.  Musculoskeletal:   Negative for focal pain or swelling All other systems reviewed and are negative except as documented above in ROS and HPI.  ____________________________________________   PHYSICAL EXAM:  VITAL SIGNS: ED Triage Vitals [12/26/16 1207]  Enc Vitals Group     BP 119/79     Pulse Rate (!) 113     Resp 16     Temp 97.9 F (36.6 C)     Temp Source Oral     SpO2 97 %     Weight 185 lb (83.9 kg)     Height _0  (1.676 m)     Head Circumference      Peak Flow      Pain Score      Pain Loc      Pain Edu?      Excl. in [place]?     Vital signs reviewed, nursing assessments reviewed.   Constitutional:   Alert and oriented. Well appearing and in no distress. Eyes:   No scleral icterus.  EOMI. No nystagmus. No conjunctival pallor. PERRL. ENT   Head:   Normocephalic and atraumatic.   Nose:   No congestion/rhinnorhea.    Mouth/Throat:    MMM, no pharyngeal erythema. No peritonsillar mass.    Neck:   No meningismus. Full ROM Hematological/Lymphatic/Immunilogical:   No cervical lymphadenopathy. Cardiovascular:   RRR. Symmetric bilateral radial and DP pulses.  No murmurs.  Respiratory:   Normal respiratory effort without tachypnea/retractions. Breath sounds are clear and equal bilaterally. No wheezes/rales/rhonchi. Gastrointestinal:   Soft and nontender. Non distended. There is no CVA tenderness.  No rebound, rigidity, or guarding. Genitourinary:   deferred Musculoskeletal:   Normal range of motion in all extremities. No joint effusions.  No lower extremity tenderness.  No edema. Neurologic:   Normal speech and language.  Motor grossly intact. Normal gait, normal balance No gross focal neurologic deficits are appreciated.  Skin:    Skin is warm, dry and intact. No rash noted.  No petechiae, purpura, or bullae.  ____________________________________________    LABS (pertinent positives/negatives) (all labs ordered are listed, but only abnormal results are displayed) Labs Reviewed  BASIC METABOLIC PANEL - Abnormal; Notable for the following:       Result Value   Sodium 133 (*)    Chloride 100 (*)    Glucose, Bld 270 (*)    All other components within normal limits  CBC - Abnormal; Notable for the following:    WBC 12.1 (*)    All other components within normal limits  GLUCOSE, CAPILLARY - Abnormal; Notable for the following:    Glucose-Capillary 260 (*)    All other components within normal limits  URINALYSIS, COMPLETE (UACMP) WITH MICROSCOPIC  CBG MONITORING, ED   ____________________________________________   EKG  Interpreted by me Sinus tachycardia rate 103, normal axis intervals QRS ST segments and T waves  ____________________________________________    RADIOLOGY  No results  found.  ____________________________________________   PROCEDURES Procedures  ____________________________________________   INITIAL IMPRESSION / ASSESSMENT AND PLAN / ED COURSE  Pertinent labs & imaging results that were available during my care of the patient were reviewed by me and considered in my medical decision making (see chart for details).  Patient presents after  syncope, due to dehydration from hyperglycemia and diabetes medication noncompliance. 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 No worrisome symptoms to suggest neurologic, cardiac, vascular condition. Vital signs unremarkable now. Tachycardia resolved with rest and IV fluids, heart rate currently about 80. Discharge in good condition. Counseled at length on resuming his metformin until he can follow up with primary care.      ____________________________________________   FINAL CLINICAL IMPRESSION(S) / ED DIAGNOSES  Final diagnoses:  Syncope, unspecified syncope type  Dehydration  Hyperglycemia      New Prescriptions   No medications on file     Portions of this note were generated with dragon dictation software. Dictation errors may occur despite best attempts at proofreading.    Carrie Mew, MD 12/26/16 1425

## 2016-12-26 NOTE — ED Notes (Signed)
First nurse note: Pt arrived via EMS from work for reports of syncopal episode. 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 Pt does not remember anything after that. Pt has not taken medications today. EMS reports CBG 354, VSS, NSR. EMS started 18g left AC. Pt has had approximately 300 mL NS.

## 2017-04-07 ENCOUNTER — Encounter: Payer: Self-pay | Admitting: Internal Medicine

## 2017-04-07 ENCOUNTER — Ambulatory Visit (INDEPENDENT_AMBULATORY_CARE_PROVIDER_SITE_OTHER): Payer: 59 | Admitting: Internal Medicine

## 2017-04-07 DIAGNOSIS — I10 Essential (primary) hypertension: Secondary | ICD-10-CM

## 2017-04-08 NOTE — Patient Instructions (Signed)
none

## 2017-04-08 NOTE — Progress Notes (Signed)
Error, pt not seen.

## 2017-08-25 ENCOUNTER — Ambulatory Visit (INDEPENDENT_AMBULATORY_CARE_PROVIDER_SITE_OTHER): Payer: 59 | Admitting: Family Medicine

## 2017-08-25 ENCOUNTER — Encounter: Payer: Self-pay | Admitting: Family Medicine

## 2017-08-25 VITALS — BP 142/78 | HR 96 | Temp 97.6°F | Ht 66.0 in | Wt 183.0 lb

## 2017-08-25 DIAGNOSIS — R05 Cough: Secondary | ICD-10-CM

## 2017-08-25 DIAGNOSIS — R059 Cough, unspecified: Secondary | ICD-10-CM | POA: Insufficient documentation

## 2017-08-25 NOTE — Assessment & Plan Note (Signed)
Likely viral in nature - Counseled on supportive care - Given indications for follow-up. If no improvement consider chest x-ray

## 2017-08-25 NOTE — Progress Notes (Signed)
Christopher Mercado - 42 y.o. male MRN [id number]  Date of birth: 11/20/1975  SUBJECTIVE:  Including CC & ROS.  Chief Complaint  Patient presents with  . Cough    Christopher Mercado is a 42 y.o. male that is presenting with a cough. Ongoing for one week.  Admits to shortness of breath and wheezing. He has been taking Delsym with no improvement.  Admits to fevers and body aches. Denies flu shot. His coworkers have similar symptoms. Admits to nonproductive cough.   Review of Systems  Constitutional: Positive for chills and fever.  HENT: Negative for rhinorrhea and sinus pain.   Respiratory: Positive for cough.   Cardiovascular: Positive for chest pain.  Gastrointestinal: Negative for abdominal pain.  Musculoskeletal: Positive for myalgias.    HISTORY: Past Medical, Surgical, Social, and Family History Reviewed & Updated per EMR.   Pertinent Historical Findings include:  Past Medical History:  Diagnosis Date  . 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 Diabetes mellitus without complication (HCC)   . 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 Elevated LFTs   . Esophageal reflux   . Gastritis   . Gastroparesis    Seen by GI once - has "lived with it"  . Hypertension   . Meningitis, unspecified(322.9)    7814-42 years of age  . Polyuria     Past Surgical History:  Procedure Laterality Date  . ACROMIO-CLAVICULAR JOINT REPAIR Right 03/08/2016   Procedure: RIGHT SHOULDER ACROMIOCLAVICULAR LIGAMENT REPAIR;  Surgeon: Teryl LucyJoshua Landau, MD;  Location: [facility];  Service: Orthopedics;  Laterality: Right;  . HERNIA REPAIR     216-728 years of age  . WISDOM TOOTH EXTRACTION      Allergies  Allergen Reactions  . Prednisone Rash  . Coconut Fatty Acids     Family History  Problem Relation Age of Onset  . Prostate cancer Father   . Cancer Father   . Heart attack Father   . Heart disease Father   . Hypertension  Mother   . CAD Mother   . Heart disease Mother   . Hyperlipidemia Maternal Grandfather   . Hypertension Maternal Grandfather   . Colon cancer Unknown        uncle  . Heart disease Unknown   . Liver disease Unknown      Social History   Socioeconomic History  . Marital status: Married    Spouse name: kimberly  . Number of children: 2  . Years of education: 11.5  . Highest education level: Not on file  Social Needs  . Financial resource strain: Not on file  . Food insecurity - worry: Not on file  . Food insecurity - inability: Not on file  . Transportation needs - medical: Not on file  . Transportation needs - non-medical: Not on file  Occupational History  . Occupation: Aeronautical engineerlandscaping  Tobacco Use  . Smoking status: Current Some Day Smoker    Packs/day: 0.50    Types: Cigarettes  . Smokeless tobacco: Current User    Types: Snuff  Substance and Sexual Activity  . Alcohol use: Yes    Comment: occassionally  . Drug use: No  . Sexual activity: Yes    Partners: Female  Other Topics Concern  . Not on file  Social History Narrative   ** Merged History Encounter **       [place]   Married for 20 years  2 children 18 and 16   Rides motorcycle for fun.   Denies spiritual/religious beliefs   Exercise: only at work   Diet: Regular, eats fruits and vegetables.     PHYSICAL EXAM:  VS: BP (!) 142/78 (BP Location: Left Arm, Patient Position: Sitting, Cuff Size: Normal)   Pulse 96   Temp 97.6 F (36.4 C) (Oral)   Ht 5\' 6"  (1.676 m)   Wt 183 lb (83 kg)   SpO2 97%   BMI 29.54 kg/m  Physical Exam Gen: NAD, alert, cooperative with exam, ENT: normal lips, normal nasal mucosa, tympanic membranes clear and intact bilaterally, normal oropharynx, no cervical lymphadenopathy Eye: normal EOM, normal conjunctiva and lids CV:  no edema, +2 pedal pulses, regular rate and rhythm, S1-S2   Resp: no accessory muscle use, non-labored, clear to auscultation  bilaterally, no crackles or wheezes Skin: no rashes, no areas of induration  Neuro: normal tone, normal sensation to touch Psych:  normal insight, alert and oriented MSK: Normal gait, normal strength       ASSESSMENT & PLAN:   Cough Likely viral in nature - Counseled on supportive care - Given indications for follow-up. If no improvement consider chest x-ray

## 2017-08-25 NOTE — Patient Instructions (Signed)
Please try things such as zyrtec-D or allegra-D which is an antihistamine and decongestant.   Please try afrin which will help with nasal congestion but use for only three days.   Please also try using a netti pot on a regular occasion.  Honey can help with a sore throat.   Vick's and Delsym can help with a cough.  

## 2017-11-02 ENCOUNTER — Ambulatory Visit: Payer: 59 | Admitting: Nurse Practitioner

## 2018-02-02 ENCOUNTER — Encounter: Payer: 59 | Admitting: Family

## 2018-02-02 DIAGNOSIS — Z0289 Encounter for other administrative examinations: Secondary | ICD-10-CM

## 2018-03-06 ENCOUNTER — Ambulatory Visit (INDEPENDENT_AMBULATORY_CARE_PROVIDER_SITE_OTHER): Payer: 59 | Admitting: Family

## 2018-03-06 ENCOUNTER — Other Ambulatory Visit (INDEPENDENT_AMBULATORY_CARE_PROVIDER_SITE_OTHER): Payer: 59

## 2018-03-06 ENCOUNTER — Encounter: Payer: Self-pay | Admitting: Family

## 2018-03-06 VITALS — BP 136/88 | HR 93 | Temp 98.1°F | Ht 66.0 in | Wt 169.0 lb

## 2018-03-06 DIAGNOSIS — E118 Type 2 diabetes mellitus with unspecified complications: Secondary | ICD-10-CM

## 2018-03-06 DIAGNOSIS — Z765 Malingerer [conscious simulation]: Secondary | ICD-10-CM

## 2018-03-06 DIAGNOSIS — E1165 Type 2 diabetes mellitus with hyperglycemia: Secondary | ICD-10-CM | POA: Diagnosis not present

## 2018-03-06 DIAGNOSIS — IMO0002 Reserved for concepts with insufficient information to code with codable children: Secondary | ICD-10-CM

## 2018-03-06 DIAGNOSIS — Z79899 Other long term (current) drug therapy: Secondary | ICD-10-CM | POA: Diagnosis not present

## 2018-03-06 DIAGNOSIS — Z125 Encounter for screening for malignant neoplasm of prostate: Secondary | ICD-10-CM

## 2018-03-06 LAB — COMPREHENSIVE METABOLIC PANEL
ALBUMIN: 4.8 g/dL (ref 3.5–5.2)
ALK PHOS: 86 U/L (ref 39–117)
ALT: 23 U/L (ref 0–53)
AST: 16 U/L (ref 0–37)
BILIRUBIN TOTAL: 0.7 mg/dL (ref 0.2–1.2)
BUN: 8 mg/dL (ref 6–23)
CO2: 29 mEq/L (ref 19–32)
CREATININE: 0.78 mg/dL (ref 0.40–1.50)
Calcium: 10 mg/dL (ref 8.4–10.5)
Chloride: 100 mEq/L (ref 96–112)
GFR: 116.12 mL/min (ref >60.00)
Glucose, Bld: 256 mg/dL — ABNORMAL HIGH (ref 70–99)
Potassium: 3.6 mEq/L (ref 3.5–5.1)
Sodium: 137 mEq/L (ref 135–145)
TOTAL PROTEIN: 7.8 g/dL (ref 6.0–8.3)

## 2018-03-06 LAB — LIPID PANEL
CHOLESTEROL: 202 mg/dL — AB (ref 0–200)
HDL: 68.1 mg/dL (ref >39.00)
LDL Cholesterol: 120 mg/dL — ABNORMAL HIGH (ref 0–99)
NonHDL: 133.88
Total CHOL/HDL Ratio: 3
Triglycerides: 68 mg/dL (ref 0.0–149.0)
VLDL: 13.6 mg/dL (ref 0.0–40.0)

## 2018-03-06 LAB — CBC WITH DIFFERENTIAL/PLATELET
Basophils Absolute: 0 10*3/uL (ref 0.0–0.1)
Basophils Relative: 0.4 % (ref 0.0–3.0)
EOS ABS: 0 10*3/uL (ref 0.0–0.7)
Eosinophils Relative: 0.5 % (ref 0.0–5.0)
HCT: 47.2 % (ref 39.0–52.0)
HEMOGLOBIN: 16.3 g/dL (ref 13.0–17.0)
Lymphocytes Relative: 23.7 % (ref 12.0–46.0)
Lymphs Abs: 1.6 10*3/uL (ref 0.7–4.0)
MCHC: 34.6 g/dL (ref 30.0–36.0)
MCV: 86.9 fl (ref 78.0–100.0)
MONO ABS: 0.4 10*3/uL (ref 0.1–1.0)
Monocytes Relative: 6.4 % (ref 3.0–12.0)
Neutro Abs: 4.7 10*3/uL (ref 1.4–7.7)
Neutrophils Relative %: 69 % (ref 43.0–77.0)
Platelets: 259 10*3/uL (ref 150.0–400.0)
RBC: 5.43 Mil/uL (ref 4.22–5.81)
RDW: 12.7 % (ref 11.5–15.5)
WBC: 6.9 10*3/uL (ref 4.0–10.5)

## 2018-03-06 LAB — PSA: PSA: 0.93 ng/mL (ref 0.10–4.00)

## 2018-03-06 LAB — HEMOGLOBIN A1C: HEMOGLOBIN A1C: 11.1 % — AB (ref 4.6–6.5)

## 2018-03-06 NOTE — Progress Notes (Signed)
Christopher Mercado is a 42 y.o. male with the following history as recorded in Epic[facility]:  Patient Active Problem List   Diagnosis Date Noted  . Cough 08/25/2017  . Bronchitis 08/12/2016  . 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 Uncontrolled type 2 diabetes mellitus with complication, without long-term current use of insulin ([place]) 12/24/2015  . Numbness and tingling 12/16/2015  . Essential hypertension 08/29/2014  . Overweight(278.02) 09/09/2011  . Abnormal LFTs (liver function tests) 09/09/2011  . RLS (restless legs syndrome) 09/09/2011  . Anxiety 09/09/2011  . GERD (gastroesophageal reflux disease) 03/01/2011  . Nondiabetic gastroparesis 03/01/2011  . ABDOMINAL PAIN 11/06/2008  . ATROPHIC GASTRITIS 11/05/2008  . MENINGITIS 11/04/2008  . NAUSEA AND VOMITING 11/04/2008  . HERNIA, HX OF 11/04/2008    No current outpatient medications on file.   No current facility-administered medications for this visit.     Allergies: Prednisone and Coconut fatty acids  Past Medical History:  Diagnosis Date  . 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 Diabetes mellitus without complication ([place])   . 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 Elevated LFTs   . Esophageal reflux   . Gastritis   . Gastroparesis    Seen by GI once - has "lived with it"  . Hypertension   . Meningitis, unspecified(322.9)    81-34 years of age  . Polyuria     Past Surgical History:  Procedure Laterality Date  . ACROMIO-CLAVICULAR JOINT REPAIR Right 03/08/2016   Procedure: RIGHT SHOULDER ACROMIOCLAVICULAR LIGAMENT REPAIR;  Surgeon: Marchia Bond, MD;  Location: [place];  Service: Orthopedics;  Laterality: Right;  . HERNIA REPAIR     68-55 years of age  . WISDOM TOOTH EXTRACTION      Family History  Problem Relation Age of Onset  . Prostate cancer Father   . Cancer Father   .  Heart attack Father   . Heart disease Father   . Hypertension Mother   . CAD Mother   . Heart disease Mother   . Hyperlipidemia Maternal Grandfather   . Hypertension Maternal Grandfather   . Colon cancer Unknown        uncle  . Heart disease Unknown   . Liver disease Unknown     Social History   Tobacco Use  . Smoking status: Current Some Day Smoker    Packs/day: 0.50    Types: Cigarettes  . Smokeless tobacco: Current User    Types: Snuff  Substance Use Topics  . Alcohol use: Yes    Comment: occassionally    Subjective:  Patient is accompanied by his wife today; has a history of Type 2 Diabetes- last Hgba1c done 18 months ago at 10.6; he opted against medication at that time and has not been on any medication since; has a documented history of hypertension but tells me he has never taken any blood pressure medication- does not remember ever being prescribed Amlodipine; Notes that has been feeling "bad" lately- prone to fits of rage recently; denies being on any type of illicit substance but his behavior in the clinic is concerning- very agitated/ wears sunglasses/ does not want to sit still during course of exam; weight is down 14 pounds since February of this year;  Objective:  Vitals:   03/06/18 1526  BP: 136/88  Pulse: 93  Temp: 98.1 F (36.7 C)  TempSrc: Oral  SpO2: 97%  Weight: 169 lb (76.7  kg)  Height: _0  (1.676 m)    General: Well developed, well nourished, in no acute distress  Skin : Warm and dry.  Head: Normocephalic and atraumatic  Lungs: Respirations unlabored; clear to auscultation bilaterally without wheeze, rales, rhonchi  CVS exam: normal rate and regular rhythm.  Neurologic: Alert and oriented; speech intact; face symmetrical; moves all extremities well; CNII-XII intact without focal deficit   Assessment:  1. Uncontrolled type 2 diabetes mellitus with complication, without long-term current use of insulin ([place])   2. Prostate cancer screening   3.  Drug-seeking behavior     Plan:  Update labs today including CBC, CMP, Hgba1c, lipid panel, microalbumin; Will also update drug screen due to patient's behavior in the office today;  Follow-up to be determined.    No follow-ups on file.  Orders Placed This Encounter  Procedures  . CBC w/Diff    Standing Status:   Future    Number of Occurrences:   1    Standing Expiration Date:   03/06/2019  . Comp Met (CMET)    Standing Status:   Future    Number of Occurrences:   1    Standing Expiration Date:   03/06/2019  . Lipid panel    Standing Status:   Future    Number of Occurrences:   1    Standing Expiration Date:   03/07/2019  . HgB A1c    Standing Status:   Future    Number of Occurrences:   1    Standing Expiration Date:   03/06/2019  . PSA    Standing Status:   Future    Number of Occurrences:   1    Standing Expiration Date:   03/06/2019  . Microalbumin, urine  . Pain Mgmt, Profile 8 w/Conf, U    Standing Status:   Future    Number of Occurrences:   1    Standing Expiration Date:   03/07/2019    Requested Prescriptions    No prescriptions requested or ordered in this encounter

## 2018-03-07 ENCOUNTER — Other Ambulatory Visit: Payer: Self-pay | Admitting: Family

## 2018-03-07 DIAGNOSIS — E119 Type 2 diabetes mellitus without complications: Secondary | ICD-10-CM

## 2018-03-07 MED ORDER — INSULIN DEGLUDEC 100 UNIT/ML ~~LOC~~ SOPN: 10.0000 [IU] | PEN_INJECTOR | Freq: Every day | SUBCUTANEOUS | 0 refills | Status: DC

## 2018-03-09 LAB — PAIN MGMT, PROFILE 8 W/CONF, U
6 Acetylmorphine: NEGATIVE ng/mL (ref <10)
ALCOHOL METABOLITES: NEGATIVE ng/mL (ref <500)
Amphetamine: 1892 ng/mL — ABNORMAL HIGH (ref <250)
Amphetamines: POSITIVE ng/mL — AB (ref <500)
Benzodiazepines: NEGATIVE ng/mL (ref <100)
Buprenorphine, Urine: NEGATIVE ng/mL (ref <5)
COCAINE METABOLITE: NEGATIVE ng/mL (ref <150)
CREATININE: 123.3 mg/dL
MDMA: NEGATIVE ng/mL (ref <500)
Marijuana Metabolite: NEGATIVE ng/mL (ref <20)
Methamphetamine: 23175 ng/mL — ABNORMAL HIGH (ref <250)
OXIDANT: NEGATIVE ug/mL (ref <200)
OXYCODONE: NEGATIVE ng/mL (ref <100)
Opiates: NEGATIVE ng/mL (ref <100)
PH: 6.69 (ref 4.5–9.0)

## 2018-03-13 ENCOUNTER — Encounter: Payer: Self-pay | Admitting: Family

## 2018-03-16 ENCOUNTER — Encounter: Payer: Self-pay | Admitting: Family

## 2018-03-16 NOTE — Progress Notes (Signed)
Letter mailed

## 2018-03-16 NOTE — Progress Notes (Signed)
+   for methamphetamine; patient has been instructed to find a new PCP for continued care; based on his behavior in the office and this test, unable to establish therapeutic relationship with him.

## 2018-03-23 ENCOUNTER — Telehealth: Payer: Self-pay

## 2018-03-23 NOTE — Telephone Encounter (Signed)
Pt does not have a PCP at this time. Due to borderline aggressive behavior at his previous visit with Ria ClockLaura Murray, patient is not permitted to be scheduled with either of our Nurse Practitioners.

## 2018-07-02 ENCOUNTER — Ambulatory Visit: Payer: 59
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
## 2018-07-24 ENCOUNTER — Encounter (HOSPITAL_COMMUNITY): Payer: Self-pay

## 2018-07-24 ENCOUNTER — Emergency Department (HOSPITAL_COMMUNITY)
Admission: EM | Admit: 2018-07-24 | Discharge: 2018-07-25 | Disposition: A | Discharge status: Home / Self Care | Payer: Self-pay | Attending: Emergency Medicine | Admitting: Emergency Medicine

## 2018-07-24 ENCOUNTER — Ambulatory Visit (INDEPENDENT_AMBULATORY_CARE_PROVIDER_SITE_OTHER): Payer: Self-pay | Admitting: *Deleted

## 2018-07-24 DIAGNOSIS — L02214 Cutaneous abscess of groin: Secondary | ICD-10-CM | POA: Insufficient documentation

## 2018-07-24 DIAGNOSIS — F419 Anxiety disorder, unspecified: Secondary | ICD-10-CM | POA: Insufficient documentation

## 2018-07-24 DIAGNOSIS — F17210 Nicotine dependence, cigarettes, uncomplicated: Secondary | ICD-10-CM | POA: Insufficient documentation

## 2018-07-24 DIAGNOSIS — Z794 Long term (current) use of insulin: Secondary | ICD-10-CM | POA: Insufficient documentation

## 2018-07-24 DIAGNOSIS — Z111 Encounter for screening for respiratory tuberculosis: Secondary | ICD-10-CM

## 2018-07-24 DIAGNOSIS — L0291 Cutaneous abscess, unspecified: Secondary | ICD-10-CM

## 2018-07-24 DIAGNOSIS — E119 Type 2 diabetes mellitus without complications: Secondary | ICD-10-CM | POA: Insufficient documentation

## 2018-07-24 DIAGNOSIS — I10 Essential (primary) hypertension: Secondary | ICD-10-CM | POA: Insufficient documentation

## 2018-07-24 NOTE — ED Triage Notes (Signed)
Pt states that he has a abscess on his R groin that has been there for two days with no drainage.

## 2018-07-25 LAB — BASIC METABOLIC PANEL
Anion gap: 10 (ref 5–15)
BUN: 9 mg/dL (ref 6–20)
CHLORIDE: 99 mmol/L (ref 98–111)
CO2: 25 mmol/L (ref 22–32)
CREATININE: 0.73 mg/dL (ref 0.61–1.24)
Calcium: 9.3 mg/dL (ref 8.9–10.3)
GFR calc Af Amer: >60 mL/min (ref >60)
GFR calc non Af Amer: >60 mL/min (ref >60)
GLUCOSE: 379 mg/dL — AB (ref 70–99)
POTASSIUM: 3.8 mmol/L (ref 3.5–5.1)
Sodium: 134 mmol/L — ABNORMAL LOW (ref 135–145)

## 2018-07-25 LAB — CBC WITH DIFFERENTIAL/PLATELET
ABS IMMATURE GRANULOCYTES: 0.02 10*3/uL (ref 0.00–0.07)
Basophils Absolute: 0 10*3/uL (ref 0.0–0.1)
Basophils Relative: 0 %
EOS PCT: 2 %
Eosinophils Absolute: 0.2 10*3/uL (ref 0.0–0.5)
HEMATOCRIT: 43.8 % (ref 39.0–52.0)
HEMOGLOBIN: 14.9 g/dL (ref 13.0–17.0)
Immature Granulocytes: 0 %
LYMPHS PCT: 21 %
Lymphs Abs: 2 10*3/uL (ref 0.7–4.0)
MCH: 29.2 pg (ref 26.0–34.0)
MCHC: 34 g/dL (ref 30.0–36.0)
MCV: 85.9 fL (ref 80.0–100.0)
MONO ABS: 0.7 10*3/uL (ref 0.1–1.0)
MONOS PCT: 8 %
NEUTROS ABS: 6.4 10*3/uL (ref 1.7–7.7)
Neutrophils Relative %: 69 %
Platelets: 232 10*3/uL (ref 150–400)
RBC: 5.1 MIL/uL (ref 4.22–5.81)
RDW: 11.4 % — ABNORMAL LOW (ref 11.5–15.5)
WBC: 9.3 10*3/uL (ref 4.0–10.5)
nRBC: 0 % (ref 0.0–0.2)

## 2018-07-25 MED ORDER — DOXYCYCLINE HYCLATE 100 MG PO CAPS: 100.0000 mg | ORAL_CAPSULE | Freq: Two times a day (BID) | ORAL | 0 refills | Status: DC

## 2018-07-25 MED ORDER — LIDOCAINE-EPINEPHRINE (PF) 2 %-1:200000 IJ SOLN
10.0000 mL | Freq: Once | INTRAMUSCULAR | Status: AC
  Administered 2018-07-25: 10 mL
  Filled 2018-07-25: qty 20

## 2018-07-25 MED ORDER — HYDROMORPHONE HCL 1 MG/ML IJ SOLN
1.0000 mg | Freq: Once | INTRAMUSCULAR | Status: AC
  Administered 2018-07-25: 1 mg via INTRAVENOUS
  Filled 2018-07-25: qty 1

## 2018-07-25 NOTE — ED Notes (Signed)
E-signature not available, pt verbalized understanding of DC instructions and prescriptions 

## 2018-07-25 NOTE — Discharge Instructions (Addendum)
Pull out the packing material in 2 days.  Monitor your blood sugar closely.  Return if your symptoms worsen.

## 2018-07-25 NOTE — ED Provider Notes (Signed)
[facility] Provider Note   CSN: [id number] Arrival date & time: 07/24/18  2028     History   Chief Complaint Chief Complaint  Patient presents with  . Abscess    HPI Christopher Mercado is a 43 y.o. male.  Patient presents to the emergency department with a chief complaint of abscess.  He states that he has an abscess in his groin.  States that he noticed it 2 days ago.  Denies any discharge or drainage.  States it is very painful.  He is diabetic.  Denies any fevers.  Denies any other associated symptoms.  The history is provided by the patient. No language interpreter was used.    Past Medical History:  Diagnosis Date  . 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 Diabetes mellitus without complication (HCC)   . 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 Elevated LFTs   . Esophageal reflux   . Gastritis   . Gastroparesis    Seen by GI once - has "lived with it"  . Hypertension   . Meningitis, unspecified(322.9)    6914-43 years of age  . Polyuria     Patient Active Problem List   Diagnosis Date Noted  . Cough 08/25/2017  . Bronchitis 08/12/2016  . 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 Uncontrolled type 2 diabetes mellitus with complication, without long-term current use of insulin (HCC) 12/24/2015  . Numbness and tingling 12/16/2015  . Essential hypertension 08/29/2014  . Overweight(278.02) 09/09/2011  . Abnormal LFTs (liver function tests) 09/09/2011  . RLS (restless legs syndrome) 09/09/2011  . Anxiety 09/09/2011  . GERD (gastroesophageal reflux disease) 03/01/2011  . Nondiabetic gastroparesis 03/01/2011  . ABDOMINAL PAIN 11/06/2008  . ATROPHIC GASTRITIS 11/05/2008  . MENINGITIS 11/04/2008  . NAUSEA AND VOMITING 11/04/2008  . HERNIA, HX OF 11/04/2008    Past Surgical History:  Procedure Laterality  Date  . ACROMIO-CLAVICULAR JOINT REPAIR Right 03/08/2016   Procedure: RIGHT SHOULDER ACROMIOCLAVICULAR LIGAMENT REPAIR;  Surgeon: Teryl LucyJoshua Landau, MD;  Location: [facility];  Service: Orthopedics;  Laterality: Right;  . HERNIA REPAIR     696-988 years of age  . WISDOM TOOTH EXTRACTION          Home Medications    Prior to Admission medications   Medication Sig Start Date End Date Taking? Authorizing Provider  doxycycline (VIBRAMYCIN) 100 MG capsule Take 1 capsule (100 mg total) by mouth 2 (two) times daily. 07/25/18   Roxy HorsemanBrowning, Aretha Levi, PA-C  insulin degludec (TRESIBA FLEXTOUCH) 100 UNIT/ML SOPN FlexTouch Pen Inject 0.1 mLs (10 Units total) into the skin daily. 03/07/18   Olive BassMurray, Laura Woodruff, FNP    Family History Family History  Problem Relation Age of Onset  . Prostate cancer Father   . Cancer Father   . Heart attack Father   . Heart disease Father   . Hypertension Mother   . CAD Mother   . Heart disease Mother   . Hyperlipidemia Maternal Grandfather   . Hypertension Maternal Grandfather   . Colon cancer Other        uncle  . Heart disease Other   . Liver disease Other     Social History Social History   Tobacco Use  . Smoking status: Current Some Day Smoker    Packs/day: 0.50    Types: Cigarettes  . Smokeless tobacco: Current User    Types: Snuff  Substance Use Topics  . Alcohol use: Yes    Comment: occassionally  . Drug use: No     Allergies   Prednisone and Coconut fatty acids   Review of Systems Review of Systems  All other systems reviewed and are negative.    Physical Exam Updated Vital Signs BP 133/90   Pulse (!) 103   Temp 98.3 F (36.8 C) (Oral)   Resp 20   SpO2 96%   Physical Exam Vitals signs and nursing note reviewed.  Constitutional:      General: He is not in acute distress.    Appearance: He is not diaphoretic.  HENT:     Head: Normocephalic and atraumatic.  Eyes:     Conjunctiva/sclera: Conjunctivae normal.      Pupils: Pupils are equal, round, and reactive to light.  Neck:     Trachea: No tracheal deviation.  Cardiovascular:     Rate and Rhythm: Normal rate.  Pulmonary:     Effort: Pulmonary effort is normal. No respiratory distress.  Abdominal:     Palpations: Abdomen is soft.  Genitourinary:    Comments: 2 x 2 centimeter abscess to right groin in crease of skin between thigh and scrotum  Musculoskeletal: Normal range of motion.  Skin:    General: Skin is warm and dry.  Neurological:     Mental Status: He is alert and oriented to person, place, and time.  Psychiatric:        Judgment: Judgment normal.      ED Treatments / Results  Labs (all labs ordered are listed, but only abnormal results are displayed) Labs Reviewed  CBC WITH DIFFERENTIAL/PLATELET - Abnormal; Notable for the following components:      Result Value   RDW 11.4 (*)    All other components within normal limits  BASIC METABOLIC PANEL - Abnormal; Notable for the following components:   Sodium 134 (*)    Glucose, Bld 379 (*)    All other components within normal limits    EKG None  Radiology No results found.  Procedures Procedures (including critical care time) INCISION AND DRAINAGE Performed by: Roxy Horseman Consent: Verbal consent obtained. Risks and benefits: risks, benefits and alternatives were discussed Type: abscess  Body area: groin  Anesthesia: local infiltration  Incision was made with a scalpel.  Local anesthetic: lidocaine 1% with epinephrine  Anesthetic total: 3 ml  Complexity: Simple Blunt dissection to break up loculations  Drainage: purulent  Drainage amount: copious  Packing material: 1/4 in iodoform gauze  Patient tolerance: Patient tolerated the procedure well with no immediate complications.    Medications Ordered in ED Medications  HYDROmorphone (DILAUDID) injection 1 mg (1 mg Intravenous Given 07/25/18 0137)  lidocaine-EPINEPHrine (XYLOCAINE W/EPI) 2  %-1:200000 (PF) injection 10 mL (10 mLs Infiltration Given 07/25/18 0137)     Initial Impression / Assessment and Plan / ED Course  I have reviewed the triage vital signs and the nursing notes.  Pertinent labs & imaging results that were available during my care of the patient were reviewed by me and considered in my medical decision making (see chart for details).     Patient with simple skin abscess.  Drained in ED without complication.  Noted to by hyperglycemic.  No sign of Fournier's gangrene.  Nontoxic-appearing.  As the abscess drained adequately, I do believe that he will do fine on outpatient antibiotics.  Return precautions given.  Final Clinical Impressions(s) / ED Diagnoses   Final diagnoses:  Abscess  ED Discharge Orders         Ordered    doxycycline (VIBRAMYCIN) 100 MG capsule  2 times daily     07/25/18 0222           Roxy HorsemanBrowning, Chandell Attridge, PA-C 07/25/18 0243    Eudelia Bunchardama, Amadeo GarnetPedro Eduardo, MD 07/25/18 0800

## 2018-07-26 LAB — TB SKIN TEST
INDURATION: 0 mm
TB SKIN TEST: NEGATIVE

## 2018-11-13 ENCOUNTER — Ambulatory Visit: Payer: Self-pay

## 2018-11-13 NOTE — Telephone Encounter (Signed)
Incoming call  from Patient with complaint of   Having achy all over his body.   Onset was 2 weeks ago .  Rates it mild.  Radiates to arms and feet.   Patient states that is is a diabetic.   radiates to feet.  Other Sx. States Patient is  SOB   Attempted to call Patient back to assess Patients SOB Called Patient contack number and called original phone number that Patient called from original number patient called from Patient reports aches are constant.  Reports that he is a diabetic.  Reviewed care advice and protocol with Patient.  Encouraged Patient to call back if Sx worsen.  Voiced understanding.                                  Reason for Disposition . [1] MODERATE neck pain (e.g., interferes with normal activities AND [2] present > 3 days  Answer Assessment - Initial Assessment Questions 1. ONSET: "When did the pain begin?"     2 weeks 2. LOCATION: "Where does it hurt?"      everywhere 3. PATTERN "Does the pain come and go, or has it been constant since it started?"      constantmild 4. SEVERITY: "How bad is the pain?"  (Scale 1-10; or mild, moderate, severe)   - MILD (1-3): doesn't interfere with normal activities    - MODERATE (4-7): interferes with normal activities or awakens from sleep    - SEVERE (8-10):  excruciating pain, unable to do any normal activities      mild 5. RADIATION: "Does the pain go anywhere else, shoot into your arms?"     Arms and feet 6. CORD SYMPTOMS: "Any weakness or numbness of the arms or legs?"     Feet alittle 7. CAUSE: "What do you think is causing the neck pain?"     diabetic 8. NECK OVERUSE: "Any recent activities that involved turning or twisting the neck?"     denies 9. OTHER SYMPTOMS: "Do you have any other symptoms?" (e.g., headache, fever, chest pain, difficulty breathing, neck swelling)   Difficulty breathing.   10. PREGNANCY: "Is there any chance you are pregnant?" "When was your last menstrual period?"       na  Protocols used: NECK  PAIN OR STIFFNESS-A-AH

## 2018-12-07 ENCOUNTER — Other Ambulatory Visit: Payer: Self-pay

## 2018-12-07 ENCOUNTER — Ambulatory Visit (INDEPENDENT_AMBULATORY_CARE_PROVIDER_SITE_OTHER): Payer: Self-pay | Admitting: Internal Medicine

## 2018-12-07 ENCOUNTER — Encounter: Payer: Self-pay | Admitting: Internal Medicine

## 2018-12-07 VITALS — BP 130/70 | HR 90 | Temp 97.2°F | Wt 188.5 lb

## 2018-12-07 DIAGNOSIS — E1140 Type 2 diabetes mellitus with diabetic neuropathy, unspecified: Secondary | ICD-10-CM

## 2018-12-07 DIAGNOSIS — E118 Type 2 diabetes mellitus with unspecified complications: Secondary | ICD-10-CM

## 2018-12-07 DIAGNOSIS — IMO0002 Reserved for concepts with insufficient information to code with codable children: Secondary | ICD-10-CM

## 2018-12-07 DIAGNOSIS — E1165 Type 2 diabetes mellitus with hyperglycemia: Secondary | ICD-10-CM

## 2018-12-07 LAB — POCT GLYCOSYLATED HEMOGLOBIN (HGB A1C): Hemoglobin A1C: 11.8 % — AB (ref 4.0–5.6)

## 2018-12-07 MED ORDER — INSULIN DEGLUDEC 100 UNIT/ML ~~LOC~~ SOPN: 8.0000 [IU] | PEN_INJECTOR | Freq: Every day | SUBCUTANEOUS | 3 refills | Status: DC

## 2018-12-07 MED ORDER — METFORMIN HCL 1000 MG PO TABS: 1000.0000 mg | ORAL_TABLET | Freq: Two times a day (BID) | ORAL | 0 refills | Status: DC

## 2018-12-07 MED ORDER — GABAPENTIN 300 MG PO CAPS: 300.0000 mg | ORAL_CAPSULE | Freq: Every day | ORAL | 0 refills | Status: DC

## 2018-12-07 MED FILL — metFORMIN HCL 1000 MG TABS: 1000 | 90 days supply | Qty: 180 | Fill #0

## 2018-12-07 MED FILL — GABAPENTIN 300 MG CAPSULE: 300 | 90 days supply | Qty: 90 | Fill #0

## 2018-12-07 NOTE — Patient Instructions (Signed)
-  Nice meeting you today!  -Start taking gabapentin 300 mg at bedtime for your numbness of hands and feet.  -Start taking metformin 1000 mg twice daily.  -Start tresiba 8 units at bedtime. For every third night that you fasting blood sugar is above 150, increase tresiba by 3 units. Keep a log of your sugars and bring in to your next appointment.  -Schedule follow up with me in 4 weeks. Please come in fasting that day.

## 2018-12-07 NOTE — Progress Notes (Signed)
Established Patient Office Visit     CC/Reason for Visit: Management of DM Previous PCP: Ria ClockLaura Murray, NP Last Visit: 02/2018  HPI: Christopher Mercado (goes by [name]) is a 43 y.o. male who is coming in today for the above mentioned reasons. Past Medical History is significant for: DM 2. 2 years ago he was started on metformin and insulin. He quit taking them because "they made him feel like a zombie".  He is now willing to resume treatment for DM. In the past few months he has noticed progressive numbness of feet and hands: "feels like needles, sometimes like my feet are in a bucket of ice water". He has been urinating a lot and has been very thirsty. As far he is aware he has no other medical conditions. He has lost about 40 pounds in the past 18 months unintentionally.   Past Medical/Surgical History: Past Medical History:  Diagnosis Date  . 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 Diabetes mellitus without complication (HCC)   . 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 Elevated LFTs   . Esophageal reflux   . Gastritis   . Gastroparesis    Seen by GI once - has "lived with it"  . Hypertension   . Meningitis, unspecified(322.9)    414-43 years of age  . Polyuria     Past Surgical History:  Procedure Laterality Date  . ACROMIO-CLAVICULAR JOINT REPAIR Right 03/08/2016   Procedure: RIGHT SHOULDER ACROMIOCLAVICULAR LIGAMENT REPAIR;  Surgeon: Teryl LucyJoshua Landau, MD;  [facility];  Service: Orthopedics;  Laterality: Right;  . HERNIA REPAIR     446-198 years of age  . WISDOM TOOTH EXTRACTION      Social History:  reports that he has been smoking cigarettes. He has been smoking about 0.50 packs per day. His smokeless tobacco use includes snuff. He reports current alcohol use. He reports that he does not use drugs.  Allergies: Allergies  Allergen Reactions  . Prednisone Rash  . Coconut Fatty Acids      Family History:  Family History  Problem Relation Age of Onset  . Prostate cancer Father   . Cancer Father   . Heart attack Father   . Heart disease Father   . Hypertension Mother   . CAD Mother   . Heart disease Mother   . Hyperlipidemia Maternal Grandfather   . Hypertension Maternal Grandfather   . Colon cancer Other        uncle  . Heart disease Other   . Liver disease Other      Current Outpatient Medications:  .  gabapentin (NEURONTIN) 300 MG capsule, Take 1 capsule (300 mg total) by mouth at bedtime., Disp: 90 capsule, Rfl: 0 .  insulin degludec (TRESIBA) 100 UNIT/ML SOPN FlexTouch Pen, Inject 0.08 mLs (8 Units total) into the skin daily., Disp: 3 pen, Rfl: 3 .  metFORMIN (GLUCOPHAGE) 1000 MG tablet, Take 1 tablet (1,000 mg total) by mouth 2 (two) times daily with a meal., Disp: 180 tablet, Rfl: 0  Review of Systems:  Constitutional: Denies fever, chills, diaphoresis, appetite change and fatigue.  HEENT: Denies photophobia, eye pain, redness, hearing loss, ear pain, congestion, sore throat, rhinorrhea, sneezing, mouth sores, trouble swallowing, neck pain, neck stiffness and tinnitus.   Respiratory: Denies SOB, DOE, cough, chest tightness,  and wheezing.   Cardiovascular: Denies chest pain, palpitations and leg swelling.  Gastrointestinal: Denies nausea, vomiting, abdominal pain, diarrhea, constipation,  blood in stool and abdominal distention.  Genitourinary: Denies dysuria, urgency, frequency, hematuria, flank pain and difficulty urinating.  Endocrine: Denies: hot or cold intolerance, sweats, changes in hair or nails, polyuria, polydipsia. Musculoskeletal: Denies myalgias, back pain, joint swelling, arthralgias and gait problem.  Skin: Denies pallor, rash and wound.  Neurological: Denies dizziness, seizures, syncope, weakness, light-headedness and headaches.  Hematological: Denies adenopathy. Easy bruising, personal or family bleeding history  Psychiatric/Behavioral:  Denies suicidal ideation, mood changes, confusion, nervousness, sleep disturbance and agitation    Physical Exam: Vitals:   12/07/18 1114  BP: 130/70  Pulse: 90  Temp: (!) 97.2 F (36.2 C)  TempSrc: Oral  SpO2: 97%  Weight: 188 lb 8 oz (85.5 kg)   Body mass index is 30.42 kg/m.  Constitutional: NAD, calm, comfortable Eyes: PERRL, lids and conjunctivae normal ENMT: Mucous membranes are dry.  Respiratory: clear to auscultation bilaterally, no wheezing, no crackles. Normal respiratory effort. No accessory muscle use.  Cardiovascular: Regular rate and rhythm, no murmurs / rubs / gallops. No extremity edema. 2+ pedal pulses. No carotid bruits.  Abdomen: no tenderness, no masses palpated. No hepatosplenomegaly. Bowel sounds positive.  Musculoskeletal: no clubbing / cyanosis. No joint deformity upper and lower extremities. Good ROM, no contractures. Normal muscle tone.  Skin: no rashes, lesions, ulcers. No induration Psychiatric: Normal judgment and insight. Alert and oriented x 3. Normal mood.    Impression and Plan:  Type 2 diabetes mellitus with diabetic neuropathy, unspecified whether long term insulin use (HCC)   -A1c in office today is 11.8. -He wants to start treatment. -Metformin 1000 mg BID. Unfortunately, with his A1c needing to drop at least 4 points, see no choice but to start long-acting insulin today. -Sample and Rx of tresiba given. -He will start at 8 units qhs. He will increase by 3 units every third day that his FBS is >150. -He has verified understanding using the teachback method. -Will return in 4 weeks for follow up with CBG log. -Will start neurontin for his peripheral neuropathy.   Time spent: 30 minutes. Greater than 50% of this time was spent in direct contact with the patient, coordinating care and discussing relevant ongoing clinical issues, including long-term effects of uncontrolled DM, diet education, insulin administration and necessity for close  follow up.     Patient Instructions  -Nice meeting you today!  -Start taking gabapentin 300 mg at bedtime for your numbness of hands and feet.  -Start taking metformin 1000 mg twice daily.  -Start tresiba 8 units at bedtime. For every third night that you fasting blood sugar is above 150, increase tresiba by 3 units. Keep a log of your sugars and bring in to your next appointment.  -Schedule follow up with me in 4 weeks. Please come in fasting that day.     Chaya Jan, MD [facility]

## 2018-12-10 ENCOUNTER — Telehealth: Payer: Self-pay | Admitting: Internal Medicine

## 2018-12-10 ENCOUNTER — Telehealth: Payer: Self-pay

## 2018-12-10 ENCOUNTER — Other Ambulatory Visit: Payer: Self-pay

## 2018-12-10 ENCOUNTER — Ambulatory Visit: Payer: Self-pay

## 2018-12-10 DIAGNOSIS — IMO0002 Reserved for concepts with insufficient information to code with codable children: Secondary | ICD-10-CM

## 2018-12-10 DIAGNOSIS — E1165 Type 2 diabetes mellitus with hyperglycemia: Secondary | ICD-10-CM

## 2018-12-10 NOTE — Telephone Encounter (Signed)
The insulin shots make him sick on his stomach  Needs help learning how to use his glucose monitor.  I scheduled the patient with a nurse visit but patient just remembered that his brother uses a glucose monitor. He is going to try and see if he can help him figure out the monitor. If not he will come into the office at the appointment time scheduled.   His readings that he had for the weekend are:  Saturday 288 Sunday 240  He's not able to get the monitor to work today

## 2018-12-10 NOTE — Progress Notes (Signed)
Patient has been trained on using a true Metrix BG monitor. He expressed understanding and repeated the steps back to me. He states that he needs a refill for true metrix lancets and test strips sent in to [facility].   Will send to [facility] for new Rx to be sent.

## 2018-12-11 ENCOUNTER — Ambulatory Visit: Payer: Self-pay | Admitting: *Deleted

## 2018-12-11 ENCOUNTER — Encounter: Payer: Self-pay | Admitting: Internal Medicine

## 2018-12-11 NOTE — Telephone Encounter (Signed)
Attempted to call patient's wife but had a bad connection. Left message on machine for patient to return our call CMR

## 2018-12-11 NOTE — Telephone Encounter (Signed)
Left message on machine  Returning patient's call. CRM

## 2018-12-11 NOTE — Telephone Encounter (Signed)
Noted; he is to follow instructions given at last visit regarding insulin titration. He should have an eye exam (we did discuss that during his visit as well).

## 2018-12-11 NOTE — Telephone Encounter (Signed)
Pt's wife calling stating that the pt was on Metformin and is now experiencing blurred vision with CBG being 280.Call was dropped during transfer from [facility] agent.  Returned call to pt's wife who states that the CBG reading of 280 was taken around 9:30 this morning and the pt did have his dose of Meformin but has not had any food today only water. Pt's wife states that the pt has been too sleepy to eat and she can barely get him up but he is still responding to her verbally.Pt's wife states that the pt is waiting to receive new strips for his monitor and she has been using the neighbor's monitor and strips because it is the same one that the pt is using currently. Pt's wife asked to recheck CBG while nurse was on the phone and pt's wife states she will return call to report CBG in approx. 5 min because she has to use the neighbor's meter. Pt's wife Cala Bradford can be contacted at [phone].  Answer Assessment - Initial Assessment Questions 1. BLOOD GLUCOSE: "What is your blood glucose level?"      280  2. ONSET: "When did you check the blood glucose?"     Around 9:30 this am 3. USUAL RANGE: "What is your glucose level usually?" (e.g., usual fasting morning value, usual evening value)     Newly diagnosed diabetic, started Metformin and Insulin on Friday 4. KETONES: "Do you check for ketones (urine or blood test strips)?" If yes, ask: "What does the test show now?"      n/a 5. TYPE 1 or 2:  "Do you know what type of diabetes you have?"  (e.g., Type 1, Type 2, Gestational; doesn't know)      Type II 6. INSULIN: "Do you take insulin?" "What type of insulin(s) do you use? What is the mode of delivery? (syringe, pen (e.g., injection or  pump)?"      Yes, insulin injection was changed from 8 units to 11 units and wife reports that pt did receive a insulin injection during visit in office on 12/10/18. Pt takes insulin at night. 7. DIABETES PILLS: "Do you take any pills for your diabetes?" If yes, ask:  "Have you missed taking any pills recently?"     Yes takes metformin during the day 8. OTHER SYMPTOMS: "Do you have any symptoms?" (e.g., fever, frequent urination, difficulty breathing, dizziness, weakness, vomiting)     No not that I know of, diarrhea now but was notified that this may come with taking diabetic medications 9. PREGNANCY: "Is there any chance you are pregnant?" "When was your last menstrual period?"     n/a  Protocols used: DIABETES - HIGH BLOOD SUGAR-A-AH

## 2018-12-11 NOTE — Telephone Encounter (Signed)
Please ask him to continue the insulin. We had discussed how he might feel off due to the change in his metabolism. Please ask him to continue the titration schedule that we had discussed at last visit. He is supposed to follow up with me in 1 month.

## 2018-12-11 NOTE — Telephone Encounter (Signed)
Pt's wife returned call to office and reports that the pt's blood glucose is now 225. Expiration date checked on the strips and noted to be 08/2020. Pt's wife states that the pt is currently eating eggs. Pt still has complaints of blurry vision and numbness to hands and feet. Pt's advised that information provided will be forwarded to PCP for recommendations. Advised pt's wife if the pt became worse to return call to office.   Reason for Disposition . [1] Blood glucose 240 - 300 mg/dL (94.8 - 01.6 mmol/L) AND [2] uses insulin (e.g., insulin-dependent, all people with type 1 diabetes)  Protocols used: DIABETES - HIGH BLOOD SUGAR-A-AH

## 2018-12-12 NOTE — Telephone Encounter (Signed)
Wife, Selena Batten, called this morning to report FBS 256. Pt. Is taking 11 units at bedtime. Will titrate up to 14 units tonight. Pt. Has vomited x 3 this morning. Instructed wife to offer something bland for breakfast. Verbalizes understanding. Call wife with any other suggestions. [phone].

## 2018-12-12 NOTE — Telephone Encounter (Signed)
Left message on machine for wife returning her call 

## 2018-12-13 NOTE — Telephone Encounter (Signed)
Left message on machine returning wife's call. How is patient doing? CRM

## 2018-12-17 ENCOUNTER — Encounter: Payer: Self-pay | Admitting: Internal Medicine

## 2018-12-18 NOTE — Telephone Encounter (Signed)
mychart messaged sent requesting a virtual visit

## 2018-12-20 ENCOUNTER — Ambulatory Visit (INDEPENDENT_AMBULATORY_CARE_PROVIDER_SITE_OTHER): Payer: Self-pay | Admitting: Internal Medicine

## 2018-12-20 ENCOUNTER — Other Ambulatory Visit: Payer: Self-pay

## 2018-12-20 DIAGNOSIS — E118 Type 2 diabetes mellitus with unspecified complications: Secondary | ICD-10-CM

## 2018-12-20 DIAGNOSIS — IMO0002 Reserved for concepts with insufficient information to code with codable children: Secondary | ICD-10-CM

## 2018-12-20 DIAGNOSIS — E1165 Type 2 diabetes mellitus with hyperglycemia: Secondary | ICD-10-CM

## 2018-12-20 NOTE — Progress Notes (Signed)
Virtual Visit via Video Note  I connected with Christopher Mercado on 12/20/18 at  3:30 PM EDT by a video enabled telemedicine application and verified that I am speaking with the correct person using two identifiers.  Location patient: home Location provider: work office Persons participating in the virtual visit: patient, provider  I discussed the limitations of evaluation and management by telemedicine and the availability of in person appointments. The patient expressed understanding and agreed to proceed.   HPI: He and his wife has scheduled this visit to discuss his CBGs. He was seen for the first time on 5/29 with an A1c of >11 and CBGs in the 300-400 range. He was started on metformin 1000 mg BID and tresiba 8 units at night with a plan to increase by 3 units every third morning that his FBS was >150. He is now up to 14 units. His CBGs are in the high 200s-low 300s. He is concerned that they are still high. He also would like to know how long before we should increase his neurontin dose. He reports mild stomach upset with metformin. We have reviewed a food diary and he is still consuming a lot of carbs mainly in the form of bread and starchy vegetables.  ROS: Constitutional: Denies fever, chills, diaphoresis, appetite change and fatigue.  HEENT: Denies photophobia, eye pain, redness, hearing loss, ear pain, congestion, sore throat, rhinorrhea, sneezing, mouth sores, trouble swallowing, neck pain, neck stiffness and tinnitus.   Respiratory: Denies SOB, DOE, cough, chest tightness,  and wheezing.   Cardiovascular: Denies chest pain, palpitations and leg swelling.  Gastrointestinal: Denies nausea, vomiting, abdominal pain, diarrhea, constipation, blood in stool and abdominal distention.  Genitourinary: Denies dysuria, urgency, frequency, hematuria, flank pain and difficulty urinating.  Endocrine: Denies: hot or cold intolerance, sweats, changes in hair or nails, polyuria,  polydipsia. Musculoskeletal: Denies myalgias, back pain, joint swelling, arthralgias and gait problem.  Skin: Denies pallor, rash and wound.  Neurological: Denies dizziness, seizures, syncope, weakness, light-headedness, numbness and headaches.  Hematological: Denies adenopathy. 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    Diabetes mellitus without complication (HCC)    Dislocation of right acromioclavicular joint with greater than 200% displacement 03/08/2016   Elevated LFTs    Esophageal reflux    Gastritis    Gastroparesis    Seen by GI once - has "lived with it"   Hypertension    Meningitis, unspecified(322.9)    37-25 years of age   Polyuria     Past Surgical History:  Procedure Laterality Date   ACROMIO-CLAVICULAR JOINT REPAIR Right 03/08/2016   Procedure: RIGHT SHOULDER ACROMIOCLAVICULAR LIGAMENT REPAIR;  Surgeon: Marchia Bond, MD;  Location: [facility];  Service: Orthopedics;  Laterality: Right;   HERNIA REPAIR     62-15 years of age   39 EXTRACTION      Family History  Problem Relation Age of Onset   Prostate cancer Father    Cancer Father    Heart attack Father    Heart disease Father    Hypertension Mother    CAD Mother    Heart disease Mother    Hyperlipidemia Maternal Grandfather    Hypertension Maternal Grandfather    Colon cancer Other        uncle   Heart disease Other  Liver disease Other     SOCIAL HX:   reports that he has been smoking cigarettes. He has been smoking about 0.50 packs per day. His smokeless tobacco use includes snuff. He reports current alcohol use. He reports that he does not use drugs.   Current Outpatient Medications:    gabapentin (NEURONTIN) 300 MG capsule,  Take 1 capsule (300 mg total) by mouth at bedtime., Disp: 90 capsule, Rfl: 0   insulin degludec (TRESIBA) 100 UNIT/ML SOPN FlexTouch Pen, Inject 0.08 mLs (8 Units total) into the skin daily., Disp: 3 pen, Rfl: 3   metFORMIN (GLUCOPHAGE) 1000 MG tablet, Take 1 tablet (1,000 mg total) by mouth 2 (two) times daily with a meal., Disp: 180 tablet, Rfl: 0  EXAM:   VITALS per patient if applicable: none reported  GENERAL: alert, oriented, appears well and in no acute distress  HEENT: atraumatic, conjunttiva clear, no obvious abnormalities on inspection of external nose and ears  NECK: normal movements of the head and neck  LUNGS: on inspection no signs of respiratory distress, breathing rate appears normal, no obvious gross increased work of breathing, gasping or wheezing  CV: no obvious cyanosis  MS: moves all visible extremities without noticeable abnormality  PSYCH/NEURO: pleasant and cooperative, no obvious depression or anxiety, speech and thought processing grossly intact  ASSESSMENT AND PLAN:   Uncontrolled type 2 diabetes mellitus with complication, without long-term current use of insulin (HCC) -Continue as is with increasing long-acting insulin dose. -He is already showing improvement in his CBGs. -No hypoglycemic events. -We have discussed less net carbs in diet. -Will refer for diabetes education. -F/u in 1 month with CBG log. -Wait at least 4 weeks before increasing neurontin dose for peripheral neuropathy.    I discussed the assessment and treatment plan with the patient. The patient was provided an opportunity to ask questions and all were answered. The patient agreed with the plan and demonstrated an understanding of the instructions.   The patient was advised to call back or seek an in-person evaluation if the symptoms worsen or if the condition fails to improve as anticipated.    Chaya JanEstela Hernandez Acosta, MD  [facility]

## 2018-12-30 ENCOUNTER — Other Ambulatory Visit: Payer: Self-pay

## 2018-12-30 ENCOUNTER — Observation Stay (HOSPITAL_COMMUNITY)
Admission: EM | Admit: 2018-12-30 | Discharge: 2018-12-31 | Disposition: A | Discharge status: Home / Self Care | Payer: Self-pay | Attending: Family Medicine | Admitting: Family Medicine

## 2018-12-30 DIAGNOSIS — G2581 Restless legs syndrome: Secondary | ICD-10-CM | POA: Insufficient documentation

## 2018-12-30 DIAGNOSIS — R7989 Other specified abnormal findings of blood chemistry: Secondary | ICD-10-CM | POA: Insufficient documentation

## 2018-12-30 DIAGNOSIS — Z1159 Encounter for screening for other viral diseases: Secondary | ICD-10-CM | POA: Insufficient documentation

## 2018-12-30 DIAGNOSIS — R55 Syncope and collapse: Principal | ICD-10-CM

## 2018-12-30 DIAGNOSIS — Z79899 Other long term (current) drug therapy: Secondary | ICD-10-CM | POA: Insufficient documentation

## 2018-12-30 DIAGNOSIS — K219 Gastro-esophageal reflux disease without esophagitis: Secondary | ICD-10-CM | POA: Insufficient documentation

## 2018-12-30 DIAGNOSIS — Z720 Tobacco use: Secondary | ICD-10-CM | POA: Diagnosis present

## 2018-12-30 DIAGNOSIS — Z794 Long term (current) use of insulin: Secondary | ICD-10-CM | POA: Insufficient documentation

## 2018-12-30 DIAGNOSIS — K3184 Gastroparesis: Secondary | ICD-10-CM | POA: Insufficient documentation

## 2018-12-30 DIAGNOSIS — F17210 Nicotine dependence, cigarettes, uncomplicated: Secondary | ICD-10-CM | POA: Insufficient documentation

## 2018-12-30 DIAGNOSIS — R109 Unspecified abdominal pain: Secondary | ICD-10-CM | POA: Insufficient documentation

## 2018-12-30 DIAGNOSIS — G8929 Other chronic pain: Secondary | ICD-10-CM | POA: Insufficient documentation

## 2018-12-30 DIAGNOSIS — E119 Type 2 diabetes mellitus without complications: Secondary | ICD-10-CM

## 2018-12-30 DIAGNOSIS — E1143 Type 2 diabetes mellitus with diabetic autonomic (poly)neuropathy: Secondary | ICD-10-CM | POA: Insufficient documentation

## 2018-12-30 DIAGNOSIS — D72829 Elevated white blood cell count, unspecified: Secondary | ICD-10-CM | POA: Insufficient documentation

## 2018-12-30 DIAGNOSIS — I10 Essential (primary) hypertension: Secondary | ICD-10-CM | POA: Insufficient documentation

## 2018-12-30 LAB — CBG MONITORING, ED
Glucose-Capillary: 212 mg/dL — ABNORMAL HIGH (ref 70–99)
Glucose-Capillary: 170 mg/dL — ABNORMAL HIGH (ref 70–99)

## 2018-12-30 LAB — CBC WITH DIFFERENTIAL/PLATELET
Abs Immature Granulocytes: 0.07 10*3/uL (ref 0.00–0.07)
Basophils Absolute: 0 10*3/uL (ref 0.0–0.1)
Basophils Relative: 0 %
Eosinophils Absolute: 0.1 10*3/uL (ref 0.0–0.5)
Eosinophils Relative: 1 %
HCT: 47.9 % (ref 39.0–52.0)
Hemoglobin: 16.8 g/dL (ref 13.0–17.0)
Immature Granulocytes: 1 %
Lymphocytes Relative: 8 %
Lymphs Abs: 1.2 10*3/uL (ref 0.7–4.0)
MCH: 30.3 pg (ref 26.0–34.0)
MCHC: 35.1 g/dL (ref 30.0–36.0)
MCV: 86.3 fL (ref 80.0–100.0)
Monocytes Absolute: 0.9 10*3/uL (ref 0.1–1.0)
Monocytes Relative: 6 %
Neutro Abs: 12.8 10*3/uL — ABNORMAL HIGH (ref 1.7–7.7)
Neutrophils Relative %: 84 %
Platelets: 266 10*3/uL (ref 150–400)
RBC: 5.55 MIL/uL (ref 4.22–5.81)
RDW: 11.9 % (ref 11.5–15.5)
WBC: 15.1 10*3/uL — ABNORMAL HIGH (ref 4.0–10.5)
nRBC: 0 % (ref 0.0–0.2)

## 2018-12-30 LAB — GLUCOSE, CAPILLARY: Glucose-Capillary: 249 mg/dL — ABNORMAL HIGH (ref 70–99)

## 2018-12-30 LAB — URINALYSIS, ROUTINE W REFLEX MICROSCOPIC
Bacteria, UA: NONE SEEN
Bilirubin Urine: NEGATIVE
Glucose, UA: >=500 mg/dL — AB
Hgb urine dipstick: NEGATIVE
Ketones, ur: 5 mg/dL — AB
Leukocytes,Ua: NEGATIVE
Nitrite: NEGATIVE
Protein, ur: 30 mg/dL — AB
Specific Gravity, Urine: 1.02 (ref 1.005–1.030)
pH: 6 (ref 5.0–8.0)

## 2018-12-30 LAB — RAPID URINE DRUG SCREEN, HOSP PERFORMED
Amphetamines: POSITIVE — AB
Barbiturates: NOT DETECTED
Benzodiazepines: NOT DETECTED
Cocaine: NOT DETECTED
Opiates: NOT DETECTED
Tetrahydrocannabinol: NOT DETECTED

## 2018-12-30 LAB — POCT I-STAT EG7
Bicarbonate: 26.6 mmol/L (ref 20.0–28.0)
Calcium, Ion: 1.25 mmol/L (ref 1.15–1.40)
HCT: 51 % (ref 39.0–52.0)
Hemoglobin: 17.3 g/dL — ABNORMAL HIGH (ref 13.0–17.0)
O2 Saturation: 59 %
Potassium: 4.1 mmol/L (ref 3.5–5.1)
Sodium: 137 mmol/L (ref 135–145)
TCO2: 28 mmol/L (ref 22–32)
pCO2, Ven: 49.9 mmHg (ref 44.0–60.0)
pH, Ven: 7.335 (ref 7.250–7.430)
pO2, Ven: 33 mmHg (ref 32.0–45.0)

## 2018-12-30 LAB — BASIC METABOLIC PANEL
Anion gap: 9 (ref 5–15)
BUN: 14 mg/dL (ref 6–20)
CO2: 25 mmol/L (ref 22–32)
Calcium: 9.7 mg/dL (ref 8.9–10.3)
Chloride: 101 mmol/L (ref 98–111)
Creatinine, Ser: 0.72 mg/dL (ref 0.61–1.24)
GFR calc Af Amer: >60 mL/min (ref >60)
GFR calc non Af Amer: >60 mL/min (ref >60)
Glucose, Bld: 227 mg/dL — ABNORMAL HIGH (ref 70–99)
Potassium: 4.1 mmol/L (ref 3.5–5.1)
Sodium: 135 mmol/L (ref 135–145)

## 2018-12-30 LAB — ETHANOL: Alcohol, Ethyl (B): <10 mg/dL (ref <10)

## 2018-12-30 LAB — MAGNESIUM: Magnesium: 2 mg/dL (ref 1.7–2.4)

## 2018-12-30 LAB — SARS CORONAVIRUS 2 BY RT PCR (HOSPITAL ORDER, PERFORMED IN ~~LOC~~ HOSPITAL LAB): SARS Coronavirus 2: NEGATIVE

## 2018-12-30 MED ORDER — ENOXAPARIN SODIUM 40 MG/0.4ML ~~LOC~~ SOLN
40.0000 mg | SUBCUTANEOUS | Status: DC
  Administered 2018-12-30: 40 mg via SUBCUTANEOUS
  Filled 2018-12-30: qty 0.4

## 2018-12-30 MED ORDER — THIAMINE HCL 100 MG/ML IJ SOLN
100.0000 mg | Freq: Every day | INTRAMUSCULAR | Status: DC
  Filled 2018-12-30: qty 2

## 2018-12-30 MED ORDER — SODIUM CHLORIDE 0.9 % IV BOLUS
1000.0000 mL | Freq: Once | INTRAVENOUS | Status: AC
  Administered 2018-12-30: 16:00:00 1000 mL via INTRAVENOUS

## 2018-12-30 MED ORDER — INSULIN ASPART 100 UNIT/ML ~~LOC~~ SOLN
0.0000 [IU] | Freq: Three times a day (TID) | SUBCUTANEOUS | Status: DC
  Administered 2018-12-31: 3 [IU] via SUBCUTANEOUS
  Administered 2018-12-31: 2 [IU] via SUBCUTANEOUS

## 2018-12-30 MED ORDER — NICOTINE 14 MG/24HR TD PT24
14.0000 mg | MEDICATED_PATCH | Freq: Every day | TRANSDERMAL | Status: DC
  Administered 2018-12-30 – 2018-12-31 (×2): 14 mg via TRANSDERMAL
  Filled 2018-12-30 (×2): qty 1

## 2018-12-30 MED ORDER — ADULT MULTIVITAMIN W/MINERALS CH
1.0000 | ORAL_TABLET | Freq: Every day | ORAL | Status: DC
  Administered 2018-12-30 – 2018-12-31 (×2): 1 via ORAL
  Filled 2018-12-30 (×2): qty 1

## 2018-12-30 MED ORDER — SODIUM CHLORIDE 0.9% FLUSH
3.0000 mL | Freq: Two times a day (BID) | INTRAVENOUS | Status: DC
  Administered 2018-12-30 – 2018-12-31 (×2): 3 mL via INTRAVENOUS

## 2018-12-30 MED ORDER — LORAZEPAM 2 MG/ML IJ SOLN: 1.0000 mg | Freq: Four times a day (QID) | INTRAMUSCULAR | Status: DC | PRN

## 2018-12-30 MED ORDER — LORAZEPAM 1 MG PO TABS: 1.0000 mg | ORAL_TABLET | Freq: Four times a day (QID) | ORAL | Status: DC | PRN

## 2018-12-30 MED ORDER — FOLIC ACID 1 MG PO TABS
1.0000 mg | ORAL_TABLET | Freq: Every day | ORAL | Status: DC
  Administered 2018-12-30 – 2018-12-31 (×2): 1 mg via ORAL
  Filled 2018-12-30 (×2): qty 1

## 2018-12-30 MED ORDER — INSULIN ASPART 100 UNIT/ML ~~LOC~~ SOLN
0.0000 [IU] | Freq: Every day | SUBCUTANEOUS | Status: DC
  Administered 2018-12-30: 2 [IU] via SUBCUTANEOUS

## 2018-12-30 MED ORDER — ACETAMINOPHEN 325 MG PO TABS: 650.0000 mg | ORAL_TABLET | Freq: Four times a day (QID) | ORAL | Status: DC | PRN

## 2018-12-30 MED ORDER — ACETAMINOPHEN 650 MG RE SUPP: 650.0000 mg | Freq: Four times a day (QID) | RECTAL | Status: DC | PRN

## 2018-12-30 MED ORDER — SODIUM CHLORIDE 0.9 % IV BOLUS
1000.0000 mL | Freq: Once | INTRAVENOUS | Status: AC
  Administered 2018-12-30: 19:00:00 1000 mL via INTRAVENOUS

## 2018-12-30 MED ORDER — VITAMIN B-1 100 MG PO TABS
100.0000 mg | ORAL_TABLET | Freq: Every day | ORAL | Status: DC
  Administered 2018-12-30 – 2018-12-31 (×2): 100 mg via ORAL
  Filled 2018-12-30 (×2): qty 1

## 2018-12-30 NOTE — H&P (Signed)
History and Physical    Christopher Mercado QQI:[id number] DOB: October 17, 1975 DOA: 12/30/2018  PCP: Isaac Bliss, Rayford Halsted, MD Patient coming from: Home  Chief Complaint: Syncope  HPI: Christopher Mercado is a 43 y.o. male with medical history significant of type 2 diabetes, GERD, gastritis, hypertension, and conditions listed below presenting to the hospital via EMS for evaluation of syncope.  Upon arrival to the ED, patient appeared to be confused.  Wife reported that patient was sitting outside in a chair under the shade with fans blowing on him when he became diaphoretic, his lips and nose turn purple, and he passed out.  They estimate around 5 minutes.  Patient did not fall or hit his head.  He was lowered to the ground by his wife and family.  Patient does not remember what happened.  He thinks he was moving his car and doing some yard work.  Denies any history of chest pain, shortness of breath, or dizziness/lightheadedness.  Does state that sometimes he feels his heart racing.  States he has been drinking monster energy drinks the past 2-3 days but did not have any today.  Denies any fevers, chills, nausea, vomiting, abdominal pain, or diarrhea.  Reports having good oral intake.  Denies any stimulant or illicit drug use.  Denies history of seizures.  States he had 2 beers today but has not had any alcoholic beverages in several months.  ED Course: Afebrile, not tachycardic, not hypotensive, not hypoxic.  Not hypoglycemic.  White count 15.1.  pH 7.33 on VBG.  UA with evidence of glucosuria but no infection.  UDS positive for amphetamines.  COVID-19 rapid test pending.  EKG showing sinus rhythm.  Orthostatics negative.  Received 2 L fluid boluses in the ED.  Review of Systems:  All systems reviewed and apart from history of presenting illness, are negative.  Past Medical History:  Diagnosis Date  . 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 Diabetes mellitus without complication  ([place])   . 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 Elevated LFTs   . Esophageal reflux   . Gastritis   . Gastroparesis    Seen by GI once - has "lived with it"  . Hypertension   . Meningitis, unspecified(322.9)    23-4 years of age  . Polyuria     Past Surgical History:  Procedure Laterality Date  . ACROMIO-CLAVICULAR JOINT REPAIR Right 03/08/2016   Procedure: RIGHT SHOULDER ACROMIOCLAVICULAR LIGAMENT REPAIR;  Surgeon: Marchia Bond, MD;  Location: [place];  Service: Orthopedics;  Laterality: Right;  . HERNIA REPAIR     9-77 years of age  . WISDOM TOOTH EXTRACTION       reports that he has been smoking cigarettes. He has been smoking about 0.50 packs per day. His smokeless tobacco use includes snuff. He reports current alcohol use. He reports that he does not use drugs.  Allergies  Allergen Reactions  . Prednisone Rash  . Coconut Fatty Acids     Family History  Problem Relation Age of Onset  . Prostate cancer Father   . Cancer Father   . Heart attack Father   . Heart disease Father   . Hypertension Mother   . CAD Mother   . Heart disease Mother   . Hyperlipidemia Maternal Grandfather   . Hypertension Maternal Grandfather   . Colon cancer Other        uncle  . Heart disease Other   .  Liver disease Other     Prior to Admission medications   Medication Sig Start Date End Date Taking? Authorizing Provider  gabapentin (NEURONTIN) 300 MG capsule Take 1 capsule (300 mg total) by mouth at bedtime. 12/07/18   Philip AspenHernandez Acosta, Limmie PatriciaEstela Y, MD  insulin degludec (TRESIBA) 100 UNIT/ML SOPN FlexTouch Pen Inject 0.08 mLs (8 Units total) into the skin daily. 12/07/18   Philip AspenHernandez Acosta, Limmie PatriciaEstela Y, MD  metFORMIN (GLUCOPHAGE) 1000 MG tablet Take 1 tablet (1,000 mg total) by mouth 2 (two) times daily with a meal. 12/07/18   Philip AspenHernandez Acosta, Limmie PatriciaEstela Y, MD    Physical Exam: Vitals:   12/30/18 1545 12/30/18 1915  12/30/18 1930 12/30/18 1945  BP: (!) 133/93 126/84 131/75 124/71  Pulse: 89 72    Resp:  18    Temp:      SpO2: 94% 95%    Weight:      Height:        Physical Exam  Constitutional: He is oriented to person, place, and time. He appears well-developed and well-nourished. No distress.  Sitting up in a stretcher eating a sandwich  HENT:  Head: Normocephalic.  Mouth/Throat: Oropharynx is clear and moist.  Eyes: Pupils are equal, round, and reactive to light. EOM are normal. Right eye exhibits no discharge. Left eye exhibits no discharge.  Neck: Neck supple.  Cardiovascular: Normal rate, regular rhythm and intact distal pulses.  Pulmonary/Chest: Effort normal and breath sounds normal. No respiratory distress. He has no wheezes. He has no rales.  Abdominal: Soft. Bowel sounds are normal. He exhibits no distension. There is no abdominal tenderness. There is no guarding.  Musculoskeletal:        General: No edema.  Neurological: He is alert and oriented to person, place, and time. No cranial nerve deficit.  Speech fluent, tongue midline, no facial droop Strength 5 out of 5 in bilateral upper and lower extremities Sensation to light touch intact throughout  Skin: Skin is warm and dry. He is not diaphoretic.  Psychiatric:  Appears anxious     Labs on Admission: I have personally reviewed following labs and imaging studies  CBC: Recent Labs  Lab 12/30/18 1530 12/30/18 1543  WBC 15.1*  --   NEUTROABS 12.8*  --   HGB 16.8 17.3*  HCT 47.9 51.0  MCV 86.3  --   PLT 266  --    Basic Metabolic Panel: Recent Labs  Lab 12/30/18 1530 12/30/18 1543  NA 135 137  K 4.1 4.1  CL 101  --   CO2 25  --   GLUCOSE 227*  --   BUN 14  --   CREATININE 0.72  --   CALCIUM 9.7  --   MG 2.0  --    GFR: Estimated Creatinine Clearance: 133.2 mL/min (by C-G formula based on SCr of 0.72 mg/dL). Liver Function Tests: No results for input(s): AST, ALT, ALKPHOS, BILITOT, PROT, ALBUMIN in the last  168 hours. No results for input(s): LIPASE, AMYLASE in the last 168 hours. No results for input(s): AMMONIA in the last 168 hours. Coagulation Profile: No results for input(s): INR, PROTIME in the last 168 hours. Cardiac Enzymes: No results for input(s): CKTOTAL, CKMB, CKMBINDEX, TROPONINI in the last 168 hours. BNP (last 3 results) No results for input(s): PROBNP in the last 8760 hours. HbA1C: No results for input(s): HGBA1C in the last 72 hours. CBG: Recent Labs  Lab 12/30/18 1520  GLUCAP 212*   Lipid Profile: No results for input(s): CHOL,  HDL, LDLCALC, TRIG, CHOLHDL, LDLDIRECT in the last 72 hours. Thyroid Function Tests: No results for input(s): TSH, T4TOTAL, FREET4, T3FREE, THYROIDAB in the last 72 hours. Anemia Panel: No results for input(s): VITAMINB12, FOLATE, FERRITIN, TIBC, IRON, RETICCTPCT in the last 72 hours. Urine analysis:    Component Value Date/Time   COLORURINE YELLOW 12/30/2018 1644   APPEARANCEUR CLEAR 12/30/2018 1644   LABSPEC 1.020 12/30/2018 1644   PHURINE 6.0 12/30/2018 1644   GLUCOSEU >=500 (A) 12/30/2018 1644   GLUCOSEU NEGATIVE 11/04/2008 1038   HGBUR NEGATIVE 12/30/2018 1644   BILIRUBINUR NEGATIVE 12/30/2018 1644   KETONESUR 5 (A) 12/30/2018 1644   PROTEINUR 30 (A) 12/30/2018 1644   UROBILINOGEN 0.2 11/04/2008 1038   NITRITE NEGATIVE 12/30/2018 1644   LEUKOCYTESUR NEGATIVE 12/30/2018 1644    Radiological Exams on Admission: No results found.  EKG: Independently reviewed. Sinus rhythm.   Assessment/Plan Principal Problem:   Syncope Active Problems:   HTN (hypertension)   Leukocytosis   Type 2 diabetes mellitus (HCC)   Tobacco use   Syncope Differentials include stimulant drug abuse vs arrhythmia.  Orthostatics negative.  UDS positive for amphetamines.  No infectious signs or symptoms.  Not hypoglycemic.  EKG showing sinus rhythm.  No arrhythmia or heart block.  History does not seem to be suggestive of seizure like activity.  AAO x3  and neuro exam nonfocal. -Cardiac monitoring -Echocardiogram  Leukocytosis Possibly reactive.  Patient is afebrile.  UA not suggestive of infection.  No respiratory complaints.  Lungs clear on exam. -Repeat CBC in a.m.  Type 2 diabetes -Check A1c.  Sliding scale insulin and CBG checks.  Hypertension -Currently normotensive.  Continue to monitor.  HIV screening The patient falls between the ages of 13-64 and should be screened for HIV, therefore HIV testing ordered.  Alcohol use Occasional per history provided by patient. -CIWA monitoring; Ativan PRN -Thiamine, folate, multivitamin  Tobacco use -Counseled to quit -NicoDerm patch  DVT prophylaxis: Lovenox Code Status: Full code Family Communication: No family available. Disposition Plan: Anticipate discharge after clinical improvement. Consults called: None Admission status: It is my clinical opinion that referral for OBSERVATION is reasonable and necessary in this patient based on the above information provided. The aforementioned taken together are felt to place the patient at high risk for further clinical deterioration. However it is anticipated that the patient may be medically stable for discharge from the hospital within 24 to 48 hours.  The medical decision making on this patient was of high complexity and the patient is at high risk for clinical deterioration, therefore this is a level 3 visit.  John GiovanniVasundhra Gilda Abboud MD [facility] Pager [phone]  If 7PM-7AM, please contact night-coverage [email or web address] Password [email or web address]  12/30/2018, 8:07 PM

## 2018-12-30 NOTE — ED Notes (Signed)
ED TO INPATIENT HANDOFF REPORT  ED Nurse Name and Phone #: Christopher Mercado, [phone]  S Name/Age/Gender Christopher PotterJames Andy Mercado 43 y.o. male Room/Bed: 036C/036C  Code Status   Code Status: Not on file  Home/SNF/Other Home Patient oriented to: self, place, time and situation Is this baseline? Yes   Triage Complete: Triage complete  Chief Complaint syncope  Triage Note PER EMS Pt passed out for 5 mins  Witnessed by Family. Pt has worked ou in [place] for the past 2 days. Pt has been drink beer and mountain dew.  EMS vitals  BP 122/80 , P 90 , O2 sats 100% ra, CBG 228   Allergies Allergies  Allergen Reactions  . Prednisone Rash  . Coconut Fatty Acids     Level of Care/Admitting Diagnosis ED Disposition    ED Disposition Condition Comment   Admit  [facility] [[id number]]  Level of Care: Telemetry Cardiac [103]  I expect the patient will be discharged within 24 hours: Yes  LOW acuity---Tx typically complete <24 hrs---ACUTE conditions typically can be evaluated <24 hours---LABS likely to return to acceptable levels <24 hours---IS near functional baseline---EXPECTED to return to current living arrangement---NOT newly hypoxic: Meets criteria for 5C-Observation unit  Covid Evaluation: Screening Protocol (No Symptoms)  Diagnosis: Syncope [206001]  Admitting Physician: John GiovanniATHORE, VASUNDHRA [[id number]]  Attending Physician: John GiovanniATHORE, VASUNDHRA [[id number]]  PT Class (Do Not Modify): Observation [104]  PT Acc Code (Do Not Modify): Observation [10022]       B Medical/Surgery History Past Medical History:  Diagnosis Date  . 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 Diabetes mellitus without complication (HCC)   . 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 Elevated LFTs   . Esophageal reflux   . Gastritis   . Gastroparesis    Seen by GI once - has "lived with it"  . Hypertension   . Meningitis,  unspecified(322.9)    6714-43 years of age  . Polyuria    Past Surgical History:  Procedure Laterality Date  . ACROMIO-CLAVICULAR JOINT REPAIR Right 03/08/2016   Procedure: RIGHT SHOULDER ACROMIOCLAVICULAR LIGAMENT REPAIR;  Surgeon: Teryl LucyJoshua Landau, MD;  Location: [facility];  Service: Orthopedics;  Laterality: Right;  . HERNIA REPAIR     186-43 years of age  . WISDOM TOOTH EXTRACTION       A IV Location/Drains/Wounds Patient Lines/Drains/Airways Status   Active Line/Drains/Airways    Name:   Placement date:   Placement time:   Site:   Days:   Peripheral IV 12/30/18 Left Antecubital   12/30/18    1440    Antecubital   less than 1   Incision (Closed) 03/08/16 Shoulder Right   03/08/16    1601     1027          Intake/Output Last 24 hours  Intake/Output Summary (Last 24 hours) at 12/30/2018 1950 Last data filed at 12/30/2018 1835 Gross per 24 hour  Intake 1000 ml  Output -  Net 1000 ml    Labs/Imaging Results for orders placed or performed during the hospital encounter of 12/30/18 (from the past 48 hour(s))  POC CBG, ED     Status: Abnormal   Collection Time: 12/30/18  3:20 PM  Result Value Ref Range   Glucose-Capillary 212 (H) 70 - 99 mg/dL   Comment 1 Notify RN    Comment 2 Document in Chart   Basic metabolic panel     Status:  Abnormal   Collection Time: 12/30/18  3:30 PM  Result Value Ref Range   Sodium 135 135 - 145 mmol/L   Potassium 4.1 3.5 - 5.1 mmol/L   Chloride 101 98 - 111 mmol/L   CO2 25 22 - 32 mmol/L   Glucose, Bld 227 (H) 70 - 99 mg/dL   BUN 14 6 - 20 mg/dL   Creatinine, Ser 0.72 0.61 - 1.24 mg/dL   Calcium 9.7 8.9 - 10.3 mg/dL   GFR calc non Af Amer >60 >60 mL/min   GFR calc Af Amer >60 >60 mL/min   Anion gap 9 5 - 15    Comment: Performed at [facility] [address] [place], [place] [address]  CBC with Differential     Status: Abnormal   Collection Time: 12/30/18  3:30 PM  Result Value Ref Range   WBC 15.1 (H) 4.0 - 10.5 K/uL    RBC 5.55 4.22 - 5.81 MIL/uL   Hemoglobin 16.8 13.0 - 17.0 g/dL   HCT 47.9 39.0 - 52.0 %   MCV 86.3 80.0 - 100.0 fL   MCH 30.3 26.0 - 34.0 pg   MCHC 35.1 30.0 - 36.0 g/dL   RDW 11.9 11.5 - 15.5 %   Platelets 266 150 - 400 K/uL   nRBC 0.0 0.0 - 0.2 %   Neutrophils Relative % 84 %   Neutro Abs 12.8 (H) 1.7 - 7.7 K/uL   Lymphocytes Relative 8 %   Lymphs Abs 1.2 0.7 - 4.0 K/uL   Monocytes Relative 6 %   Monocytes Absolute 0.9 0.1 - 1.0 K/uL   Eosinophils Relative 1 %   Eosinophils Absolute 0.1 0.0 - 0.5 K/uL   Basophils Relative 0 %   Basophils Absolute 0.0 0.0 - 0.1 K/uL   Immature Granulocytes 1 %   Abs Immature Granulocytes 0.07 0.00 - 0.07 K/uL    Comment: Performed at [facility] [address], [place], [place] [address]  Magnesium     Status: None   Collection Time: 12/30/18  3:30 PM  Result Value Ref Range   Magnesium 2.0 1.7 - 2.4 mg/dL    Comment: Performed at [facility], [place] [address] [place], [place] [address]  POCT I-Stat EG7     Status: Abnormal   Collection Time: 12/30/18  3:43 PM  Result Value Ref Range   pH, Ven 7.335 7.250 - 7.430   pCO2, Ven 49.9 44.0 - 60.0 mmHg   pO2, Ven 33.0 32.0 - 45.0 mmHg   Bicarbonate 26.6 20.0 - 28.0 mmol/L   TCO2 28 22 - 32 mmol/L   O2 Saturation 59.0 %   Sodium 137 135 - 145 mmol/L   Potassium 4.1 3.5 - 5.1 mmol/L   Calcium, Ion 1.25 1.15 - 1.40 mmol/L   HCT 51.0 39.0 - 52.0 %   Hemoglobin 17.3 (H) 13.0 - 17.0 g/dL   Patient temperature HIDE    Sample type VENOUS    Comment NOTIFIED PHYSICIAN   Urinalysis, Routine w reflex microscopic     Status: Abnormal   Collection Time: 12/30/18  4:44 PM  Result Value Ref Range   Color, Urine YELLOW YELLOW   APPearance CLEAR CLEAR   Specific Gravity, Urine 1.020 1.005 - 1.030   pH 6.0 5.0 - 8.0   Glucose, UA >=500 (A) NEGATIVE mg/dL   Hgb urine dipstick NEGATIVE NEGATIVE   Bilirubin Urine NEGATIVE NEGATIVE   Ketones, ur 5 (A) NEGATIVE mg/dL  Protein, ur 30 (A)  NEGATIVE mg/dL   Nitrite NEGATIVE NEGATIVE   Leukocytes,Ua NEGATIVE NEGATIVE   RBC / HPF 0-5 0 - 5 RBC/hpf   WBC, UA 0-5 0 - 5 WBC/hpf   Bacteria, UA NONE SEEN NONE SEEN   Mucus PRESENT     Comment: Performed at [facility], [address] [place], [place] [address]  Urine rapid drug screen (hosp performed)     Status: Abnormal   Collection Time: 12/30/18  4:44 PM  Result Value Ref Range   Opiates NONE DETECTED NONE DETECTED   Cocaine NONE DETECTED NONE DETECTED   Benzodiazepines NONE DETECTED NONE DETECTED   Amphetamines POSITIVE (A) NONE DETECTED   Tetrahydrocannabinol NONE DETECTED NONE DETECTED   Barbiturates NONE DETECTED NONE DETECTED    Comment: (NOTE) DRUG SCREEN FOR MEDICAL PURPOSES ONLY.  IF CONFIRMATION IS NEEDED FOR ANY PURPOSE, NOTIFY LAB WITHIN 5 DAYS. LOWEST DETECTABLE LIMITS FOR URINE DRUG SCREEN Drug Class                     Cutoff (ng/mL) Amphetamine and metabolites    1000 Barbiturate and metabolites    200 Benzodiazepine                 200 Tricyclics and metabolites     300 Opiates and metabolites        300 Cocaine and metabolites        300 THC                            50 Performed at [facility], [address] [place], [place] [address]    No results found.  Pending Labs Unresulted Labs (From admission, onward)    Start     Ordered   12/30/18 1942  Ethanol  Add-on,   AD     12/30/18 1941   12/30/18 1914  SARS Coronavirus 2 (CEPHEID - Performed in [facility] lab), Hosp Order  (Asymptomatic Patients Labs)  Once,   STAT    Question:  Rule Out  Answer:  Yes   12/30/18 1913          Vitals/Pain Today's Vitals   12/30/18 1500 12/30/18 1515 12/30/18 1530 12/30/18 1545  BP: 129/82 (!) 128/92 120/86 (!) 133/93  Pulse: 96 87 85 89  Resp: (!) 21 (!) 23    Temp:      SpO2: 94% 95% 95% 94%  Weight:      Height:      PainSc:        Isolation Precautions No active isolations  Medications Medications  sodium  chloride 0.9 % bolus 1,000 mL (0 mLs Intravenous Stopped 12/30/18 1835)  sodium chloride 0.9 % bolus 1,000 mL (1,000 mLs Intravenous New Bag/Given 12/30/18 1838)    Mobility walks Low fall risk   Focused Assessments Neuro Assessment Handoff:  Swallow screen pass? Yes    NIH Stroke Scale ( + Modified Stroke Scale Criteria)  Interval: Initial Level of Consciousness (1a.)   : Alert, keenly responsive LOC Questions (1b. )   +: Answers both questions correctly LOC Commands (1c. )   + : Performs both tasks correctly Best Gaze (2. )  +: Normal Visual (3. )  +: No visual loss Facial Palsy (4. )    : Normal symmetrical movements Motor Arm, Left (5a. )   +: No drift Motor Arm, Right (5b. )   +:  No drift Motor Leg, Left (6a. )   +: No drift Motor Leg, Right (6b. )   +: No drift Limb Ataxia (7. ): Absent Sensory (8. )   +: Normal, no sensory loss Best Language (9. )   +: No aphasia Dysarthria (10. ): Normal Extinction/Inattention (11.)   +: No Abnormality Modified SS Total  +: 0 Complete NIHSS TOTAL: 0     Neuro Assessment: Exceptions to WDL Neuro Checks:   Initial (12/30/18 1928)  Last Documented NIHSS Modified Score: 0 (12/30/18 1928) Has TPA been given? No If patient is a Neuro Trauma and patient is going to OR before floor call report to 4N Charge nurse: [phone] or [phone]     R Recommendations: See Admitting Provider Note  Report given to:   Additional Notes:

## 2018-12-30 NOTE — ED Provider Notes (Signed)
[facility] EMERGENCY DEPARTMENT Provider Note   CSN: [id number] Arrival date & time: 12/30/18  1432    History   Chief Complaint Chief Complaint  Patient presents with  . Loss of Consciousness    HPI Christopher Mercado is a 42 y.o. male with history of diabetes and diabetic neuropathy who presents for evaluation following syncopal episode.  Patient arrives confused about what happened.  Per the patient's wife, patient was sitting outside in a chair under the shade with fans blowing on him when he became diaphoretic, his lips and nose turn purple, and he passed out.  They estimate around 5 minutes.  Patient has been complaining of not feeling great today and that his vision was blurry.  He described as a foam across his eyes.  His blood sugars have been high lately around 300-3 40.  Patient did not fall or hit his head.  He was lowered to the ground by his wife and family.  Patient reports he feels normal now.  He has chronic abdominal pain related to gastroparesis and chronic foot pain related to neuropathy.  Patient denies any chest pain, shortness of breath, nausea, vomiting, fevers.  Patient reports drinking 2 beers today.  He is also drinking water and diet [facility].     The history is provided by the patient and the spouse.  Loss of Consciousness Associated symptoms: no chest pain, no fever, no headaches, no nausea, no shortness of breath and no vomiting     Past Medical History:  Diagnosis Date  . 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 Diabetes mellitus without complication ([place])   . 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 Elevated LFTs   . Esophageal reflux   . Gastritis   . Gastroparesis    Seen by GI once - has "lived with it"  . Hypertension   . Meningitis, unspecified(322.9)    59-55 years of age  . Polyuria     Patient Active Problem List   Diagnosis Date Noted  .  Syncope 12/30/2018  . Leukocytosis 12/30/2018  . Type 2 diabetes mellitus ([place]) 12/30/2018  . Tobacco use 12/30/2018  . Cough 08/25/2017  . Bronchitis 08/12/2016  . 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 Uncontrolled type 2 diabetes mellitus with complication, without long-term current use of insulin ([place]) 12/24/2015  . Numbness and tingling 12/16/2015  . HTN (hypertension) 08/29/2014  . Overweight(278.02) 09/09/2011  . Abnormal LFTs (liver function tests) 09/09/2011  . RLS (restless legs syndrome) 09/09/2011  . Anxiety 09/09/2011  . GERD (gastroesophageal reflux disease) 03/01/2011  . Nondiabetic gastroparesis 03/01/2011  . ABDOMINAL PAIN 11/06/2008  . ATROPHIC GASTRITIS 11/05/2008  . MENINGITIS 11/04/2008  . NAUSEA AND VOMITING 11/04/2008  . HERNIA, HX OF 11/04/2008    Past Surgical History:  Procedure Laterality Date  . ACROMIO-CLAVICULAR JOINT REPAIR Right 03/08/2016   Procedure: RIGHT SHOULDER ACROMIOCLAVICULAR LIGAMENT REPAIR;  Surgeon: Marchia Bond, MD;  Location: [facility];  Service: Orthopedics;  Laterality: Right;  . HERNIA REPAIR     45-69 years of age  . WISDOM TOOTH EXTRACTION          Home Medications    Prior to Admission medications   Medication Sig Start Date End Date Taking? Authorizing Provider  acetaminophen (TYLENOL) 500 MG tablet Take 500-1,000 mg by mouth every 6 (six) hours as needed for mild pain or headache.  Yes [provider]  diphenhydrAMINE (BENADRYL) 25 mg capsule Take 25 mg by mouth every 8 (eight) hours as needed for itching or allergies.   Yes [provider]  gabapentin (NEURONTIN) 300 MG capsule Take 1 capsule (300 mg total) by mouth at bedtime. 12/07/18  Yes Philip AspenHernandez Acosta, Limmie PatriciaEstela Y, MD  insulin degludec (TRESIBA) 100 UNIT/ML SOPN FlexTouch Pen Inject 0.08 mLs (8 Units total) into the skin daily. Patient taking differently: Inject 17 Units into the  skin at bedtime.  12/07/18  Yes Philip AspenHernandez Acosta, Limmie PatriciaEstela Y, MD  metFORMIN (GLUCOPHAGE) 1000 MG tablet Take 1 tablet (1,000 mg total) by mouth 2 (two) times daily with a meal. 12/07/18  Yes Philip AspenHernandez Acosta, Limmie PatriciaEstela Y, MD    Family History Family History  Problem Relation Age of Onset  . Prostate cancer Father   . Cancer Father   . Heart attack Father   . Heart disease Father   . Hypertension Mother   . CAD Mother   . Heart disease Mother   . Hyperlipidemia Maternal Grandfather   . Hypertension Maternal Grandfather   . Colon cancer Other        uncle  . Heart disease Other   . Liver disease Other     Social History Social History   Tobacco Use  . Smoking status: Current Some Day Smoker    Packs/day: 0.50    Types: Cigarettes  . Smokeless tobacco: Current User    Types: Snuff  Substance Use Topics  . Alcohol use: Yes    Comment: occassionally  . Drug use: No     Allergies   Coconut fatty acids and Prednisone   Review of Systems Review of Systems  Constitutional: Negative for chills and fever.  HENT: Negative for facial swelling and sore throat.   Respiratory: Negative for shortness of breath.   Cardiovascular: Positive for syncope. Negative for chest pain.  Gastrointestinal: Negative for abdominal pain, nausea and vomiting.  Genitourinary: Negative for dysuria.  Musculoskeletal: Negative for back pain.  Skin: Negative for rash and wound.  Neurological: Positive for syncope. Negative for headaches.  Psychiatric/Behavioral: The patient is not nervous/anxious.      Physical Exam Updated Vital Signs BP 124/71   Pulse 72   Temp 98.7 F (37.1 C)   Resp 18   Ht 5\' 9"  (1.753 m)   Wt 89.8 kg   SpO2 95%   BMI 29.24 kg/m   Physical Exam Vitals signs and nursing note reviewed.  Constitutional:      General: He is not in acute distress.    Appearance: He is well-developed. He is not diaphoretic.  HENT:     Head: Normocephalic and atraumatic.      Mouth/Throat:     Pharynx: No oropharyngeal exudate.  Eyes:     General: No scleral icterus.       Right eye: No discharge.        Left eye: No discharge.     Extraocular Movements: Extraocular movements intact.     Conjunctiva/sclera: Conjunctivae normal.     Pupils: Pupils are equal, round, and reactive to light.  Neck:     Musculoskeletal: Normal range of motion and neck supple.     Thyroid: No thyromegaly.  Cardiovascular:     Rate and Rhythm: Normal rate and regular rhythm.     Heart sounds: Normal heart sounds. No murmur. No friction rub. No gallop.   Pulmonary:     Effort: Pulmonary effort is normal. No respiratory  distress.     Breath sounds: Normal breath sounds. No stridor. No wheezing or rales.  Abdominal:     General: Bowel sounds are normal. There is no distension.     Palpations: Abdomen is soft.     Tenderness: There is no abdominal tenderness. There is no guarding or rebound.  Lymphadenopathy:     Cervical: No cervical adenopathy.  Skin:    General: Skin is warm and dry.     Coloration: Skin is not pale.     Findings: No rash.     Comments: Flushed/sunburnt in the face  Neurological:     Mental Status: He is alert.     Coordination: Coordination normal.     Comments: CN 3-12 intact; normal sensation throughout; 5/5 strength in all 4 extremities; equal bilateral grip strength      ED Treatments / Results  Labs (all labs ordered are listed, but only abnormal results are displayed) Labs Reviewed  BASIC METABOLIC PANEL - Abnormal; Notable for the following components:      Result Value   Glucose, Bld 227 (*)    All other components within normal limits  CBC WITH DIFFERENTIAL/PLATELET - Abnormal; Notable for the following components:   WBC 15.1 (*)    Neutro Abs 12.8 (*)    All other components within normal limits  URINALYSIS, ROUTINE W REFLEX MICROSCOPIC - Abnormal; Notable for the following components:   Glucose, UA >=500 (*)    Ketones, ur 5 (*)     Protein, ur 30 (*)    All other components within normal limits  RAPID URINE DRUG SCREEN, HOSP PERFORMED - Abnormal; Notable for the following components:   Amphetamines POSITIVE (*)    All other components within normal limits  CBG MONITORING, ED - Abnormal; Notable for the following components:   Glucose-Capillary 212 (*)    All other components within normal limits  POCT I-STAT EG7 - Abnormal; Notable for the following components:   Hemoglobin 17.3 (*)    All other components within normal limits  CBG MONITORING, ED - Abnormal; Notable for the following components:   Glucose-Capillary 170 (*)    All other components within normal limits  SARS CORONAVIRUS 2 (HOSPITAL ORDER, PERFORMED IN [facility])  MAGNESIUM  ETHANOL  HIV ANTIBODY (ROUTINE TESTING W REFLEX)  CBC  HEMOGLOBIN A1C    EKG EKG Interpretation  Date/Time:  Sunday December 30 2018 14:37:43 EDT Ventricular Rate:  90 PR Interval:    QRS Duration: 101 QT Interval:  364 QTC Calculation: 446 R Axis:   99 Text Interpretation:  Sinus rhythm Borderline right axis deviation Confirmed by Tilden Fossaees, Elizabeth [phone]([id number]) on 12/30/2018 2:41:47 PM   Radiology No results found.  Procedures Procedures (including critical care time)  Medications Ordered in ED Medications  sodium chloride flush (NS) 0.9 % injection 3 mL (has no administration in time range)  enoxaparin (LOVENOX) injection 40 mg (has no administration in time range)  acetaminophen (TYLENOL) tablet 650 mg (has no administration in time range)    Or  acetaminophen (TYLENOL) suppository 650 mg (has no administration in time range)  nicotine (NICODERM CQ - dosed in mg/24 hours) patch 14 mg (has no administration in time range)  LORazepam (ATIVAN) tablet 1 mg (has no administration in time range)    Or  LORazepam (ATIVAN) injection 1 mg (has no administration in time range)  thiamine (VITAMIN B-1) tablet 100 mg (has no administration in time range)    Or  thiamine (B-1) injection 100 mg (has no administration in time range)  folic acid (FOLVITE) tablet 1 mg (has no administration in time range)  multivitamin with minerals tablet 1 tablet (has no administration in time range)  insulin aspart (novoLOG) injection 0-9 Units (has no administration in time range)  insulin aspart (novoLOG) injection 0-5 Units (has no administration in time range)  sodium chloride 0.9 % bolus 1,000 mL (0 mLs Intravenous Stopped 12/30/18 1835)  sodium chloride 0.9 % bolus 1,000 mL (0 mLs Intravenous Stopped 12/30/18 2004)     Initial Impression / Assessment and Plan / ED Course  I have reviewed the triage vital signs and the nursing notes.  Pertinent labs & imaging results that were available during my care of the patient were reviewed by me and considered in my medical decision making (see chart for details).        Patient presenting following an episode of syncope with surrounding amnesia.  Labs are unremarkable except for leukocytosis of 15.1.  Considering patient's story and confusion, I feel observation and syncope work-up is necessary.  Patient and wife understand and agree with plan.  I discussed patient case [facility].  I appreciate her assistance with the patient.  Patient also evaluated by my attending, Dr. Madilyn Hookees, who guided the patient's management and agrees with plan.  Final Clinical Impressions(s) / ED Diagnoses   Final diagnoses:  Syncope    ED Discharge Orders    None       Verdis PrimeLaw, Miara Emminger M, PA-C 12/30/18 2022    Tilden Fossaees, Elizabeth, MD 01/02/19 [phone]

## 2018-12-30 NOTE — ED Notes (Signed)
Please contact Maudie Mercury, pt's wife at [phone] with updates.

## 2018-12-30 NOTE — ED Triage Notes (Signed)
PER EMS Pt passed out for 5 mins  Witnessed by Family. Pt has worked ou in [place] for the past 2 days. Pt has been drink beer and mountain dew.  EMS vitals  BP 122/80 , P 90 , O2 sats 100% ra, CBG 228

## 2018-12-31 ENCOUNTER — Telehealth: Payer: Self-pay | Admitting: Internal Medicine

## 2018-12-31 ENCOUNTER — Observation Stay (HOSPITAL_BASED_OUTPATIENT_CLINIC_OR_DEPARTMENT_OTHER): Payer: Self-pay

## 2018-12-31 DIAGNOSIS — R55 Syncope and collapse: Secondary | ICD-10-CM

## 2018-12-31 LAB — GLUCOSE, CAPILLARY
Glucose-Capillary: 225 mg/dL — ABNORMAL HIGH (ref 70–99)
Glucose-Capillary: 190 mg/dL — ABNORMAL HIGH (ref 70–99)
Glucose-Capillary: 236 mg/dL — ABNORMAL HIGH (ref 70–99)
Glucose-Capillary: 219 mg/dL — ABNORMAL HIGH (ref 70–99)

## 2018-12-31 LAB — ECHOCARDIOGRAM COMPLETE
Height: 69 in
Weight: 2976 oz

## 2018-12-31 LAB — CBC
HCT: 43.5 % (ref 39.0–52.0)
Hemoglobin: 15.3 g/dL (ref 13.0–17.0)
MCH: 30.4 pg (ref 26.0–34.0)
MCHC: 35.2 g/dL (ref 30.0–36.0)
MCV: 86.5 fL (ref 80.0–100.0)
Platelets: 230 10*3/uL (ref 150–400)
RBC: 5.03 MIL/uL (ref 4.22–5.81)
RDW: 12 % (ref 11.5–15.5)
WBC: 6.5 10*3/uL (ref 4.0–10.5)
nRBC: 0 % (ref 0.0–0.2)

## 2018-12-31 LAB — HIV ANTIBODY (ROUTINE TESTING W REFLEX): HIV Screen 4th Generation wRfx: NONREACTIVE

## 2018-12-31 MED ORDER — LIVING WELL WITH DIABETES BOOK
Freq: Once | Status: DC
  Filled 2018-12-31: qty 1

## 2018-12-31 MED ORDER — INSULIN STARTER KIT- PEN NEEDLES (ENGLISH)
1.0000 | Freq: Once | Status: DC
  Filled 2018-12-31: qty 1

## 2018-12-31 NOTE — Progress Notes (Signed)
Inpatient Diabetes Program Recommendations  AACE/ADA: New Consensus Statement on Inpatient Glycemic Control (2015)  Target Ranges:  Prepandial:   less than 140 mg/dL      Peak postprandial:   less than 180 mg/dL (1-2 hours)      Critically ill patients:  140 - 180 mg/dL   Lab Results  Component Value Date   GLUCAP 190 (H) 12/31/2018   HGBA1C 11.8 (A) 12/07/2018    Review of Glycemic Control Results for Christopher Mercado, Christopher "ANDY" (MRN [id number]) as of 12/31/2018 10:30  Ref. Range 12/30/2018 14:31 12/30/2018 15:20 12/30/2018 20:06 12/30/2018 21:04 12/31/2018 05:40  Glucose-Capillary Latest Ref Range: 70 - 99 mg/dL 225 (H) 212 (H) 170 (H) 249 (H) 190 (H)   Diabetes history: DM2 Outpatient Diabetes medications: Tresiba 17 units qd + Metformin 1 gm bid Current orders for Inpatient glycemic control: Novolog sensitive correction tid + hs 0-5 units  Inpatient Diabetes Program Recommendations:   On patient's office visit with Dr. Jerilee Hoh on 5/29 20 A1c was 11.8 and MD spoke with patient about current DM medications and patient had discontinued without alerting MD due to "feeling like a zombie". Patient agreed to restart Metformin and also started on Tresiba insulin. -Start Lantus 10 units daily  Thank you, Nani Gasser. Margery Szostak, RN, MSN, CDE  Diabetes Coordinator Inpatient Glycemic Control Team Team Pager [phone] (8am-5pm) 12/31/2018 10:38 AM

## 2018-12-31 NOTE — Progress Notes (Signed)
  Echocardiogram 2D Echocardiogram has been performed.  Darlina Sicilian M 12/31/2018, 8:43 AM

## 2018-12-31 NOTE — Telephone Encounter (Signed)
FYI that pt is in the hospital.

## 2018-12-31 NOTE — Progress Notes (Signed)
Per patient he already use flexipen insulin at home, so no need for futher education. Patient d/c home per MD order.

## 2018-12-31 NOTE — Discharge Summary (Signed)
Physician Discharge Summary  Hideo Googe Quadros XHB:[id number] DOB: [birth year] DOA: 12/30/2018  PCP: Isaac Bliss, Rayford Halsted, MD  Admit date: 12/30/2018 Discharge date: 12/31/2018  Admitted From: Home Disposition: Home  Recommendations for Outpatient Follow-up:  1. Follow up with PCP in 1-2 weeks 2. Please obtain BMP/CBC in one week 3. Please follow up on the following pending results:  Home Health: None Equipment/Devices: None  Discharge Condition: Stable CODE STATUS: Full code Diet recommendation: Regular  Subjective: Patient seen and examined.  He just felt generalized tiredness and no other complaint.  Has not had any more dizziness and has been walking in the room to the bathroom without having any complaints at all.  Brief/Interim Summary: Christopher Mercado is a 43 year old Caucasian gentleman with past medical history of type 2 diabetes mellitus, gastritis, GERD, hypertension who was brought into the emergency department via EMS on 12/29/2020 due to episode of syncope.  Upon arrival to the ED, patient was slightly confused.  According to the wife, patient was sitting outside in a chair under the shade with fans blowing on him when he suddenly became diaphoretic and his lips turned blue and purple and he passed out.  They estimated passing out time about 5 minutes.  Patient did not hit his head or fall and instead he was lowered to the ground by his wife and family.  Patient does not remember the event.  He thinks that he was moving his car and doing some yard work.  He did not have any chest pain or shortness of breath, lightheadedness or dizziness prior to the episode and after the episode.  He endorsed drinking monster energy drinks for the past 2 to 3 days.  He had no other complaint.  Upon arrival to the emergency department, he was slightly confused but rest of his vitals and his blood pressure were normal.  He was admitted to hospital service for further work-up and observation  for syncope.  He was monitored on telemetry which did not show any cardiac event.  Patient had transthoracic echo which ruled out valvular heart defect either.  Today he is doing much better with no symptoms.  He also had mild leukocytosis.  He received some IV fluids and his leukocytosis has resolved.  Although he claims that he drinks plenty of water however based on the labs, he might have had dehydration which has caused syncope and this is resolved now so he will be discharged back to home and he will follow with his PCP.  Discharge Diagnoses:  Principal Problem:   Syncope Active Problems:   HTN (hypertension)   Leukocytosis   Type 2 diabetes mellitus ([place])   Tobacco use    Discharge Instructions  Discharge Instructions    Discharge patient   Complete by: As directed    Discharge disposition: 01-Home or Self Care   Discharge patient date: 12/31/2018     Allergies as of 12/31/2018      Reactions   Coconut Fatty Acids Hives, Shortness Of Breath, Swelling   Prednisone Rash      Medication List    TAKE these medications   acetaminophen 500 MG tablet Commonly known as: TYLENOL Take 500-1,000 mg by mouth every 8 (eight) hours as needed for mild pain or headache.   diphenhydrAMINE 25 mg capsule Commonly known as: BENADRYL Take 25 mg by mouth every 8 (eight) hours as needed for itching or allergies.   gabapentin 300 MG capsule Commonly known as: NEURONTIN Take 1 capsule (300  mg total) by mouth at bedtime.   insulin degludec 100 UNIT/ML Sopn FlexTouch Pen Commonly known as: TRESIBA Inject 0.08 mLs (8 Units total) into the skin daily. What changed:   how much to take  when to take this   metFORMIN 1000 MG tablet Commonly known as: GLUCOPHAGE Take 1 tablet (1,000 mg total) by mouth 2 (two) times daily with a meal.      Follow-up Information    Philip AspenHernandez Acosta, Limmie PatriciaEstela Y, MD.   Specialty: Internal Medicine Contact information: [address]   [place] [address] [phone]          Allergies  Allergen Reactions  . Coconut Fatty Acids Hives, Shortness Of Breath and Swelling  . Prednisone Rash    Consultations: None   Procedures/Studies:  No results found.   Discharge Exam: Vitals:   12/31/18 0744 12/31/18 1204  BP: 116/82 113/74  Pulse: 84 81  Resp: 17 18  Temp: 98.4 F (36.9 C) 98.2 F (36.8 C)  SpO2: 96% 97%   Vitals:   12/31/18 0028 12/31/18 0605 12/31/18 0744 12/31/18 1204  BP: 115/77 117/74 116/82 113/74  Pulse: 76 72 84 81  Resp: 18 20 17 18   Temp: 97.9 F (36.6 C) 98 F (36.7 C) 98.4 F (36.9 C) 98.2 F (36.8 C)  TempSrc: Oral Oral Oral Oral  SpO2: 95% 98% 96% 97%  Weight:  84.4 kg    Height:        General: Pt is alert, awake, not in acute distress Cardiovascular: RRR, S1/S2 +, no rubs, no gallops Respiratory: CTA bilaterally, no wheezing, no rhonchi Abdominal: Soft, NT, ND, bowel sounds + Extremities: no edema, no cyanosis    The results of significant diagnostics from this hospitalization (including imaging, microbiology, ancillary and laboratory) are listed below for reference.     Microbiology: Recent Results (from the past 240 hour(s))  SARS Coronavirus 2 (CEPHEID - Performed in [facility] hospital lab), Hosp Order     Status: None   Collection Time: 12/30/18  7:40 PM   Specimen: Nasopharyngeal Swab  Result Value Ref Range Status   SARS Coronavirus 2 NEGATIVE NEGATIVE Final    Comment: (NOTE) If result is NEGATIVE SARS-CoV-2 target nucleic acids are NOT DETECTED. The SARS-CoV-2 RNA is generally detectable in upper and lower  respiratory specimens during the acute phase of infection. The lowest  concentration of SARS-CoV-2 viral copies this assay can detect is 250  copies / mL. A negative result does not preclude SARS-CoV-2 infection  and should not be used as the sole basis for treatment or other  patient management decisions.  A negative result may occur with  improper  specimen collection / handling, submission of specimen other  than nasopharyngeal swab, presence of viral mutation(s) within the  areas targeted by this assay, and inadequate number of viral copies  (<250 copies / mL). A negative result must be combined with clinical  observations, patient history, and epidemiological information. If result is POSITIVE SARS-CoV-2 target nucleic acids are DETECTED. The SARS-CoV-2 RNA is generally detectable in upper and lower  respiratory specimens dur ing the acute phase of infection.  Positive  results are indicative of active infection with SARS-CoV-2.  Clinical  correlation with patient history and other diagnostic information is  necessary to determine patient infection status.  Positive results do  not rule out bacterial infection or co-infection with other viruses. If result is PRESUMPTIVE POSTIVE SARS-CoV-2 nucleic acids MAY BE PRESENT.   A presumptive positive result  was obtained on the submitted specimen  and confirmed on repeat testing.  While 2019 novel coronavirus  (SARS-CoV-2) nucleic acids may be present in the submitted sample  additional confirmatory testing may be necessary for epidemiological  and / or clinical management purposes  to differentiate between  SARS-CoV-2 and other Sarbecovirus currently known to infect humans.  If clinically indicated additional testing with an alternate test  methodology [phone]) is advised. The SARS-CoV-2 RNA is generally  detectable in upper and lower respiratory sp ecimens during the acute  phase of infection. The expected result is Negative. Fact Sheet for Patients:  BoilerBrush.com.cyhttps://www.fda.gov/media/136312/download Fact Sheet for Healthcare Providers: https://pope.com/https://www.fda.gov/media/136313/download This test is not yet approved or cleared by the [place] FDA and has been authorized for detection and/or diagnosis of SARS-CoV-2 by FDA under an Emergency Use Authorization (EUA).  This EUA will remain in  effect (meaning this test can be used) for the duration of the COVID-19 declaration under Section 564(b)(1) of the Act, 21 U.S.C. section 360bbb-3(b)(1), unless the authorization is terminated or revoked sooner. Performed at [facility], [address] [place], [place] [address]      Labs: BNP (last 3 results) No results for input(s): BNP in the last 8760 hours. Basic Metabolic Panel: Recent Labs  Lab 12/30/18 1530 12/30/18 1543  NA 135 137  K 4.1 4.1  CL 101  --   CO2 25  --   GLUCOSE 227*  --   BUN 14  --   CREATININE 0.72  --   CALCIUM 9.7  --   MG 2.0  --    Liver Function Tests: No results for input(s): AST, ALT, ALKPHOS, BILITOT, PROT, ALBUMIN in the last 168 hours. No results for input(s): LIPASE, AMYLASE in the last 168 hours. No results for input(s): AMMONIA in the last 168 hours. CBC: Recent Labs  Lab 12/30/18 1530 12/30/18 1543 12/31/18 0539  WBC 15.1*  --  6.5  NEUTROABS 12.8*  --   --   HGB 16.8 17.3* 15.3  HCT 47.9 51.0 43.5  MCV 86.3  --  86.5  PLT 266  --  230   Cardiac Enzymes: No results for input(s): CKTOTAL, CKMB, CKMBINDEX, TROPONINI in the last 168 hours. BNP: Invalid input(s): POCBNP CBG: Recent Labs  Lab 12/30/18 1520 12/30/18 2006 12/30/18 2104 12/31/18 0540 12/31/18 1144  GLUCAP 212* 170* 249* 190* 236*   D-Dimer No results for input(s): DDIMER in the last 72 hours. Hgb A1c No results for input(s): HGBA1C in the last 72 hours. Lipid Profile No results for input(s): CHOL, HDL, LDLCALC, TRIG, CHOLHDL, LDLDIRECT in the last 72 hours. Thyroid function studies No results for input(s): TSH, T4TOTAL, T3FREE, THYROIDAB in the last 72 hours.  Invalid input(s): FREET3 Anemia work up No results for input(s): VITAMINB12, FOLATE, FERRITIN, TIBC, IRON, RETICCTPCT in the last 72 hours. Urinalysis    Component Value Date/Time   COLORURINE YELLOW 12/30/2018 1644   APPEARANCEUR CLEAR 12/30/2018 1644   LABSPEC 1.020 12/30/2018  1644   PHURINE 6.0 12/30/2018 1644   GLUCOSEU >=500 (A) 12/30/2018 1644   GLUCOSEU NEGATIVE 11/04/2008 1038   HGBUR NEGATIVE 12/30/2018 1644   BILIRUBINUR NEGATIVE 12/30/2018 1644   KETONESUR 5 (A) 12/30/2018 1644   PROTEINUR 30 (A) 12/30/2018 1644   UROBILINOGEN 0.2 11/04/2008 1038   NITRITE NEGATIVE 12/30/2018 1644   LEUKOCYTESUR NEGATIVE 12/30/2018 1644   Sepsis Labs Invalid input(s): PROCALCITONIN,  WBC,  LACTICIDVEN Microbiology Recent Results (from the past 240 hour(s))  SARS  Coronavirus 2 (CEPHEID - Performed in [facility] hospital lab), Hosp Order     Status: None   Collection Time: 12/30/18  7:40 PM   Specimen: Nasopharyngeal Swab  Result Value Ref Range Status   SARS Coronavirus 2 NEGATIVE NEGATIVE Final    Comment: (NOTE) If result is NEGATIVE SARS-CoV-2 target nucleic acids are NOT DETECTED. The SARS-CoV-2 RNA is generally detectable in upper and lower  respiratory specimens during the acute phase of infection. The lowest  concentration of SARS-CoV-2 viral copies this assay can detect is 250  copies / mL. A negative result does not preclude SARS-CoV-2 infection  and should not be used as the sole basis for treatment or other  patient management decisions.  A negative result may occur with  improper specimen collection / handling, submission of specimen other  than nasopharyngeal swab, presence of viral mutation(s) within the  areas targeted by this assay, and inadequate number of viral copies  (<250 copies / mL). A negative result must be combined with clinical  observations, patient history, and epidemiological information. If result is POSITIVE SARS-CoV-2 target nucleic acids are DETECTED. The SARS-CoV-2 RNA is generally detectable in upper and lower  respiratory specimens dur ing the acute phase of infection.  Positive  results are indicative of active infection with SARS-CoV-2.  Clinical  correlation with patient history and other diagnostic information is   necessary to determine patient infection status.  Positive results do  not rule out bacterial infection or co-infection with other viruses. If result is PRESUMPTIVE POSTIVE SARS-CoV-2 nucleic acids MAY BE PRESENT.   A presumptive positive result was obtained on the submitted specimen  and confirmed on repeat testing.  While 2019 novel coronavirus  (SARS-CoV-2) nucleic acids may be present in the submitted sample  additional confirmatory testing may be necessary for epidemiological  and / or clinical management purposes  to differentiate between  SARS-CoV-2 and other Sarbecovirus currently known to infect humans.  If clinically indicated additional testing with an alternate test  methodology [phone]([id number]) is advised. The SARS-CoV-2 RNA is generally  detectable in upper and lower respiratory sp ecimens during the acute  phase of infection. The expected result is Negative. Fact Sheet for Patients:  BoilerBrush.com.cyhttps://www.fda.gov/media/136312/download Fact Sheet for Healthcare Providers: https://pope.com/https://www.fda.gov/media/136313/download This test is not yet approved or cleared by the [place] FDA and has been authorized for detection and/or diagnosis of SARS-CoV-2 by FDA under an Emergency Use Authorization (EUA).  This EUA will remain in effect (meaning this test can be used) for the duration of the COVID-19 declaration under Section 564(b)(1) of the Act, 21 U.S.C. section 360bbb-3(b)(1), unless the authorization is terminated or revoked sooner. Performed at [facility], [address] [place], [place] [address]      Time coordinating discharge: 29 minutes  SIGNED:   Hughie Clossavi Daniela Siebers, MD  [facility] 12/31/2018, 1:04 PM Pager [phone]  If 7PM-7AM, please contact night-coverage [email or web address] Password [email or web address]

## 2018-12-31 NOTE — Discharge Instructions (Signed)
Syncope Syncope is when you pass out (faint) for a short time. It is caused by a sudden decrease in blood flow to the brain. Signs that you may be about to pass out include:  Feeling dizzy or light-headed.  Feeling sick to your stomach (nauseous).  Seeing all white or all black.  Having cold, clammy skin. If you pass out, get help right away. Call your local emergency services (911 in the [place] Do not drive yourself to the hospital. Follow these instructions at home: Watch for any changes in your symptoms. Take these actions to stay safe and help with your symptoms: Lifestyle  Do not drive, use machinery, or play sports until your doctor says it is okay.  Do not drink alcohol.  Do not use any products that contain nicotine or tobacco, such as cigarettes and e-cigarettes. If you need help quitting, ask your doctor.  Drink enough fluid to keep your pee (urine) pale yellow. General instructions  Take over-the-counter and prescription medicines only as told by your doctor.  If you are taking blood pressure or heart medicine, sit up and stand up slowly. Spend a few minutes getting ready to sit and then stand. This can help you feel less dizzy.  Have someone stay with you until you feel stable.  If you start to feel like you might pass out, lie down right away and raise (elevate) your feet above the level of your heart. Breathe deeply and steadily. Wait until all of the symptoms are gone.  Keep all follow-up visits as told by your doctor. This is important. Get help right away if:  You have a very bad headache.  You pass out once or more than once.  You have pain in your chest, belly, or back.  You have a very fast or uneven heartbeat (palpitations).  It hurts to breathe.  You are bleeding from your mouth or your bottom (rectum).  You have black or tarry poop (stool).  You have jerky movements that you cannot control (seizure).  You are confused.  You have trouble  walking.  You are very weak.  You have vision problems. These symptoms may be an emergency. Do not wait to see if the symptoms will go away. Get medical help right away. Call your local emergency services (911 in the [place] Do not drive yourself to the hospital. Summary  Syncope is when you pass out (faint) for a short time. It is caused by a sudden decrease in blood flow to the brain.  Signs that you may be about to faint include feeling dizzy, light-headed, or sick to your stomach, seeing all white or all black, or having cold, clammy skin.  If you start to feel like you might pass out, lie down right away and raise (elevate) your feet above the level of your heart. Breathe deeply and steadily. Wait until all of the symptoms are gone. This information is not intended to replace advice given to you by your health care provider. Make sure you discuss any questions you have with your health care provider. Document Released: 12/14/2007 Document Revised: 08/09/2017 Document Reviewed: 08/09/2017 Elsevier Interactive Patient Education  2019 [employer] Inc.  

## 2019-01-01 LAB — HEMOGLOBIN A1C
Hgb A1c MFr Bld: 10.1 % — ABNORMAL HIGH (ref 4.8–5.6)
Mean Plasma Glucose: 243 mg/dL

## 2019-01-02 ENCOUNTER — Other Ambulatory Visit: Payer: Self-pay

## 2019-01-02 ENCOUNTER — Encounter: Payer: Self-pay | Admitting: Internal Medicine

## 2019-01-02 ENCOUNTER — Ambulatory Visit (INDEPENDENT_AMBULATORY_CARE_PROVIDER_SITE_OTHER): Payer: Self-pay | Admitting: Internal Medicine

## 2019-01-02 VITALS — BP 110/70 | HR 76 | Temp 98.4°F | Wt 192.3 lb

## 2019-01-02 DIAGNOSIS — F1910 Other psychoactive substance abuse, uncomplicated: Secondary | ICD-10-CM

## 2019-01-02 DIAGNOSIS — E1142 Type 2 diabetes mellitus with diabetic polyneuropathy: Secondary | ICD-10-CM

## 2019-01-02 DIAGNOSIS — H538 Other visual disturbances: Secondary | ICD-10-CM

## 2019-01-02 DIAGNOSIS — E1140 Type 2 diabetes mellitus with diabetic neuropathy, unspecified: Secondary | ICD-10-CM

## 2019-01-02 DIAGNOSIS — Z09 Encounter for follow-up examination after completed treatment for conditions other than malignant neoplasm: Secondary | ICD-10-CM

## 2019-01-02 MED ORDER — GABAPENTIN 300 MG PO CAPS: 600.0000 mg | ORAL_CAPSULE | Freq: Every day | ORAL | 0 refills | Status: DC

## 2019-01-02 NOTE — Patient Instructions (Signed)
-  Nice seeing you today!!  -Increase gabapentin to 600 mg at bedtime.  -Keep increasing insulin as instructed.  -Schedule follow up in 3 months.

## 2019-01-02 NOTE — Progress Notes (Signed)
Established Patient Office Visit     CC/Reason for Visit: Hospital follow-up  HPI: Christopher Mercado is a 43 y.o. male who is coming in today for the above mentioned reasons. Past Medical History is significant for: Uncontrolled type 2 diabetes, diabetic peripheral neuropathy and persistently positive tox screens for methamphetamines.  He was hospitalized June 21 through June 22 after a syncopal event.  He does not remember any of the events.  His wife states that the grandchildren had just left, he was sitting in the garage and suddenly just slumped over.  Preceding this he had been complaining of blurry vision.  Of note which is started seeing him about 6 weeks ago for uncontrolled diabetes, he is currently on [place] for which he is increasing doses on a titration scale that I have given him.  He is now up to 24 units of Tresiba at nighttime.  His A1c has improved from 11.8-10.6 in that timeframe.  Review of hospital records shows a positive urine tox screen for methamphetamines.  He is not prescribed Adderall.  Upon discussing with patient he does not take Adderall or any other stimulants that are not prescribed to him, he denies smoking methamphetamines.  I have checked through his chart, it appears that he was discharged from his prior practice due to positive methamphetamine screen coupled with irrational behavior in office.  He has been feeling improved since hospital discharge other than feeling "sluggish" and fatigue.  Continues to complain of blurry vision.   Past Medical/Surgical History: Past Medical History:  Diagnosis Date  . 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 Diabetes mellitus without complication (HCC)   . 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 Elevated LFTs   . Esophageal reflux   . Gastritis   . Gastroparesis    Seen by GI once - has "lived with it"  . Hypertension   . Meningitis,  unspecified(322.9)    6714-43 years of age  . Polyuria     Past Surgical History:  Procedure Laterality Date  . ACROMIO-CLAVICULAR JOINT REPAIR Right 03/08/2016   Procedure: RIGHT SHOULDER ACROMIOCLAVICULAR LIGAMENT REPAIR;  Surgeon: Teryl LucyJoshua Landau, MD;  Location: [facility];  Service: Orthopedics;  Laterality: Right;  . HERNIA REPAIR     326-668 years of age  . WISDOM TOOTH EXTRACTION      Social History:  reports that he has been smoking cigarettes. He has been smoking about 0.50 packs per day. His smokeless tobacco use includes snuff. He reports current alcohol use. He reports that he does not use drugs.  Allergies: Allergies  Allergen Reactions  . Coconut Fatty Acids Hives, Shortness Of Breath and Swelling  . Prednisone Rash    Family History:  Family History  Problem Relation Age of Onset  . Prostate cancer Father   . Cancer Father   . Heart attack Father   . Heart disease Father   . Hypertension Mother   . CAD Mother   . Heart disease Mother   . Hyperlipidemia Maternal Grandfather   . Hypertension Maternal Grandfather   . Colon cancer Other        uncle  . Heart disease Other   . Liver disease Other      Current Outpatient Medications:  .  acetaminophen (TYLENOL) 500 MG tablet, Take 500-1,000 mg by mouth every 8 (eight) hours as needed for mild pain or headache. , Disp: , Rfl:  .  diphenhydrAMINE (BENADRYL) 25 mg capsule, Take 25 mg by mouth every 8 (eight) hours as needed for itching or allergies., Disp: , Rfl:  .  gabapentin (NEURONTIN) 300 MG capsule, Take 2 capsules (600 mg total) by mouth at bedtime., Disp: 180 capsule, Rfl: 0 .  insulin degludec (TRESIBA) 100 UNIT/ML SOPN FlexTouch Pen, Inject 0.08 mLs (8 Units total) into the skin daily. (Patient taking differently: Inject 17 Units into the skin at bedtime. ), Disp: 3 pen, Rfl: 3 .  metFORMIN (GLUCOPHAGE) 1000 MG tablet, Take 1 tablet (1,000 mg total) by mouth 2 (two) times daily with a meal., Disp:  180 tablet, Rfl: 0  Review of Systems:  Constitutional: Denies fever, chills, diaphoresis, appetite change. HEENT: Denies photophobia, eye pain, redness, hearing loss, ear pain, congestion, sore throat, rhinorrhea, sneezing, mouth sores, trouble swallowing, neck pain, neck stiffness and tinnitus.   Respiratory: Denies SOB, DOE, cough, chest tightness,  and wheezing.   Cardiovascular: Denies chest pain, palpitations and leg swelling.  Gastrointestinal: Denies nausea, vomiting, abdominal pain, diarrhea, constipation, blood in stool and abdominal distention.  Genitourinary: Denies dysuria, urgency, frequency, hematuria, flank pain and difficulty urinating.  Endocrine: Denies: hot or cold intolerance, sweats, changes in hair or nails, polyuria, polydipsia. Musculoskeletal: Denies myalgias, back pain, joint swelling, arthralgias and gait problem.  Skin: Denies pallor, rash and wound.  Neurological: Denies dizziness, seizures, syncope, weakness, light-headedness, numbness and headaches.  Hematological: Denies adenopathy. Easy bruising, personal or family bleeding history  Psychiatric/Behavioral: Denies suicidal ideation, mood changes, confusion, nervousness, sleep disturbance and agitation    Physical Exam: Vitals:   01/02/19 1329  BP: 110/70  Pulse: 76  Temp: 98.4 F (36.9 C)  TempSrc: Oral  SpO2: 95%  Weight: 192 lb 4.8 oz (87.2 kg)    Body mass index is 28.4 kg/m.   Constitutional: NAD, calm, comfortable Eyes: PERRL, lids and conjunctivae normal, wearing sunglasses. ENMT: Mucous membranes are moist.  Respiratory: clear to auscultation bilaterally, no wheezing, no crackles. Normal respiratory effort. No accessory muscle use.  Cardiovascular: Regular rate and rhythm, no murmurs / rubs / gallops. No extremity edema. 2+ pedal pulses. No carotid bruits.  Abdomen: no tenderness, no masses palpated. No hepatosplenomegaly. Bowel sounds positive.  Musculoskeletal: no clubbing / cyanosis.  No joint deformity upper and lower extremities. Good ROM, no contractures. Normal muscle tone.  Skin: no rashes, lesions, ulcers. No induration Neurologic: CN 2-12 grossly intact. Sensation intact, DTR normal. Strength 5/5 in all 4.     Impression and Plan:  Hospital discharge follow-up  Substance abuse [facility])  -He was admitted for a syncopal event, work-up in the hospital was normal with the exception of a positive tox screen for methamphetamines. -Was given IV fluids and it was thought that he might have been slightly dehydrated. -We have discussed his positive tox screen today, he denies unprescribed use of stimulants or illicit substances. -See no need to discharge him from the practice at this time as he has not been belligerent and is not on controlled substances prescribed through me. -We will continue to discuss throughout subsequent visits,  Type 2 diabetes mellitus with diabetic neuropathy, unspecified whether long term insulin use (HCC) -Improved, A1c has dropped from 11.8-10.6 in about 4 weeks. -Continue titrating [place] as instructed.  Diabetic peripheral neuropathy (HCC)  -He states his numbness and tingling of his fingertips and toes is improved, however not completely resolved. -Will escalate gabapentin dose from 300 to 600 mg at bedtime.  Blurry vision, bilateral -Of course, I am  concerned for diabetic retinopathy, will refer to ophthalmology today.    Patient Instructions  -Nice seeing you today!!  -Increase gabapentin to 600 mg at bedtime.  -Keep increasing insulin as instructed.  -Schedule follow up in 3 months.     Chaya JanEstela Hernandez Acosta, MD [facility]

## 2019-01-14 ENCOUNTER — Encounter: Payer: Self-pay | Admitting: Internal Medicine

## 2019-01-14 DIAGNOSIS — E1140 Type 2 diabetes mellitus with diabetic neuropathy, unspecified: Secondary | ICD-10-CM

## 2019-01-15 MED ORDER — INSULIN DEGLUDEC 100 UNIT/ML ~~LOC~~ SOPN: 24.0000 [IU] | PEN_INJECTOR | Freq: Every day | SUBCUTANEOUS | 3 refills | Status: DC

## 2019-01-15 MED ORDER — INSULIN PEN NEEDLE 29G X 12MM MISC: 3 refills | Status: DC

## 2019-01-16 MED FILL — UNIFINE PENTIPS 32GX5/32: 32G X 4 MM | 90 days supply | Qty: 100 | Fill #0

## 2019-01-16 MED FILL — UNIFINE PENTIPS 32GX5/32": 32G X 4 MM | 90 days supply | Qty: 100 | Fill #0

## 2019-03-11 ENCOUNTER — Encounter: Payer: Self-pay | Admitting: Internal Medicine

## 2019-03-11 DIAGNOSIS — E1140 Type 2 diabetes mellitus with diabetic neuropathy, unspecified: Secondary | ICD-10-CM

## 2019-03-12 MED ORDER — GABAPENTIN 300 MG PO CAPS: 600.0000 mg | ORAL_CAPSULE | Freq: Every day | ORAL | 0 refills | Status: DC

## 2019-04-30 ENCOUNTER — Encounter: Payer: Self-pay | Admitting: Internal Medicine

## 2019-05-07 ENCOUNTER — Encounter: Payer: Self-pay | Admitting: *Deleted

## 2019-05-07 ENCOUNTER — Telehealth (INDEPENDENT_AMBULATORY_CARE_PROVIDER_SITE_OTHER): Payer: Self-pay | Admitting: Internal Medicine

## 2019-05-07 ENCOUNTER — Other Ambulatory Visit: Payer: Self-pay

## 2019-05-07 DIAGNOSIS — IMO0002 Reserved for concepts with insufficient information to code with codable children: Secondary | ICD-10-CM

## 2019-05-07 DIAGNOSIS — E1140 Type 2 diabetes mellitus with diabetic neuropathy, unspecified: Secondary | ICD-10-CM

## 2019-05-07 DIAGNOSIS — E118 Type 2 diabetes mellitus with unspecified complications: Secondary | ICD-10-CM

## 2019-05-07 DIAGNOSIS — E1142 Type 2 diabetes mellitus with diabetic polyneuropathy: Secondary | ICD-10-CM

## 2019-05-07 DIAGNOSIS — E1165 Type 2 diabetes mellitus with hyperglycemia: Secondary | ICD-10-CM

## 2019-05-07 MED ORDER — GABAPENTIN 300 MG PO CAPS: 900.0000 mg | ORAL_CAPSULE | Freq: Every day | ORAL | 0 refills | Status: DC

## 2019-05-07 MED ORDER — INSULIN DEGLUDEC 100 UNIT/ML ~~LOC~~ SOPN: 24.0000 [IU] | PEN_INJECTOR | Freq: Every day | SUBCUTANEOUS | 3 refills | Status: DC

## 2019-05-07 MED ORDER — METFORMIN HCL 1000 MG PO TABS: 1000.0000 mg | ORAL_TABLET | Freq: Two times a day (BID) | ORAL | 0 refills | Status: DC

## 2019-05-07 MED FILL — metFORMIN HCL 1000 MG TABS: 1000 | 90 days supply | Qty: 180 | Fill #0

## 2019-05-07 MED FILL — GABAPENTIN 300 MG CAPSULE: 300 | 90 days supply | Qty: 270 | Fill #0

## 2019-05-07 NOTE — Progress Notes (Signed)
Virtual Visit via Video Note  I connected with Christopher Mercado on 05/07/19 at  1:30 PM EDT by a video enabled telemedicine application and verified that I am speaking with the correct person using two identifiers.  Location patient: home Location provider: work office Persons participating in the virtual visit: patient, provider  I discussed the limitations of evaluation and management by telemedicine and the availability of in person appointments. The patient expressed understanding and agreed to proceed.   HPI: He has scheduled this visit for medication refills as well as continued concerns with diabetic peripheral neuropathy.  His A1c was last 10.1 in June 2020.  He states his CBGs are still around the 1 60-200 range.  I have not seen him in the office since June.  He continues to have a sensation of pins-and-needles on the soles of his feet that keep him up at night.   ROS: Constitutional: Denies fever, chills, diaphoresis, appetite change and fatigue.  HEENT: Denies photophobia, eye pain, redness, hearing loss, ear pain, congestion, sore throat, rhinorrhea, sneezing, mouth sores, trouble swallowing, neck pain, neck stiffness and tinnitus.   Respiratory: Denies SOB, DOE, cough, chest tightness,  and wheezing.   Cardiovascular: Denies chest pain, palpitations and leg swelling.  Gastrointestinal: Denies nausea, vomiting, abdominal pain, diarrhea, constipation, blood in stool and abdominal distention.  Genitourinary: Denies dysuria, urgency, frequency, hematuria, flank pain and difficulty urinating.  Endocrine: Denies: hot or cold intolerance, sweats, changes in hair or nails, polyuria, polydipsia. Musculoskeletal: Denies myalgias, back pain, joint swelling, arthralgias and gait problem.  Skin: Denies pallor, rash and wound.  Neurological: Denies dizziness, seizures, syncope, weakness, light-headedness, numbness and headaches.  Hematological: Denies adenopathy. Easy bruising,  personal or family bleeding history  Psychiatric/Behavioral: Denies suicidal ideation, mood changes, confusion, nervousness, sleep disturbance and agitation   Past Medical History:  Diagnosis Date  . 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 Diabetes mellitus without complication (HCC)   . 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 Elevated LFTs   . Esophageal reflux   . Gastritis   . Gastroparesis    Seen by GI once - has "lived with it"  . Hypertension   . Meningitis, unspecified(322.9)    41-46 years of age  . Polyuria     Past Surgical History:  Procedure Laterality Date  . ACROMIO-CLAVICULAR JOINT REPAIR Right 03/08/2016   Procedure: RIGHT SHOULDER ACROMIOCLAVICULAR LIGAMENT REPAIR;  Surgeon: Teryl Lucy, MD;  Location: [facility];  Service: Orthopedics;  Laterality: Right;  . HERNIA REPAIR     5-50 years of age  . WISDOM TOOTH EXTRACTION      Family History  Problem Relation Age of Onset  . Prostate cancer Father   . Cancer Father   . Heart attack Father   . Heart disease Father   . Hypertension Mother   . CAD Mother   . Heart disease Mother   . Hyperlipidemia Maternal Grandfather   . Hypertension Maternal Grandfather   . Colon cancer Other        uncle  . Heart disease Other   . Liver disease Other     SOCIAL HX:   reports that he has been smoking cigarettes. He has been smoking about 0.50 packs per day. His smokeless tobacco use includes snuff. He reports current alcohol use. He reports that he does not use drugs.   Current Outpatient Medications:  .  acetaminophen (TYLENOL) 500 MG tablet,  Take 500-1,000 mg by mouth every 8 (eight) hours as needed for mild pain or headache. , Disp: , Rfl:  .  diphenhydrAMINE (BENADRYL) 25 mg capsule, Take 25 mg by mouth every 8 (eight) hours as needed for itching or allergies., Disp: , Rfl:  .  gabapentin (NEURONTIN) 300 MG capsule, Take 3  capsules (900 mg total) by mouth at bedtime., Disp: 270 capsule, Rfl: 0 .  insulin degludec (TRESIBA) 100 UNIT/ML SOPN FlexTouch Pen, Inject 0.24 mLs (24 Units total) into the skin daily., Disp: 3 pen, Rfl: 3 .  Insulin Pen Needle 29G X 12MM MISC, Use to inject [place], Disp: 100 each, Rfl: 3 .  metFORMIN (GLUCOPHAGE) 1000 MG tablet, Take 1 tablet (1,000 mg total) by mouth 2 (two) times daily with a meal., Disp: 180 tablet, Rfl: 0  EXAM:   VITALS per patient if applicable: None reported  GENERAL: alert, oriented, appears well and in no acute distress  HEENT: atraumatic, conjunttiva clear, no obvious abnormalities on inspection of external nose and ears  NECK: normal movements of the head and neck  LUNGS: on inspection no signs of respiratory distress, breathing rate appears normal, no obvious gross increased work of breathing, gasping or wheezing  CV: no obvious cyanosis  MS: moves all visible extremities without noticeable abnormality  PSYCH/NEURO: pleasant and cooperative, no obvious depression or anxiety, speech and thought processing grossly intact  ASSESSMENT AND PLAN:   Uncontrolled type 2 diabetes mellitus with complication, without long-term current use of insulin (HCC) Diabetic peripheral neuropathy ([place])  -He needs to come in office for continued diabetic care and to check his A1c, which I suspect will not be at goal given his current A1c levels. -Will increase gabapentin from 600 mg to 900 mg at nighttime. -He has been instructed to continue to uptitrate his insulin as previously advised: Increase Tresiba by 3 units every third morning that his fasting blood sugar is above 150. -We will schedule follow-up appointment in office.    I discussed the assessment and treatment plan with the patient. The patient was provided an opportunity to ask questions and all were answered. The patient agreed with the plan and demonstrated an understanding of the instructions.   The  patient was advised to call back or seek an in-person evaluation if the symptoms worsen or if the condition fails to improve as anticipated.    Lelon Frohlich, MD  [facility] at [facility]

## 2019-06-12 ENCOUNTER — Ambulatory Visit: Payer: Self-pay | Admitting: Internal Medicine

## 2019-07-02 ENCOUNTER — Other Ambulatory Visit: Payer: Self-pay

## 2019-07-03 ENCOUNTER — Ambulatory Visit (INDEPENDENT_AMBULATORY_CARE_PROVIDER_SITE_OTHER): Payer: Self-pay | Admitting: Internal Medicine

## 2019-07-03 ENCOUNTER — Encounter: Payer: Self-pay | Admitting: Internal Medicine

## 2019-07-03 VITALS — BP 110/64 | HR 83 | Temp 97.9°F | Wt 187.9 lb

## 2019-07-03 DIAGNOSIS — IMO0002 Reserved for concepts with insufficient information to code with codable children: Secondary | ICD-10-CM

## 2019-07-03 DIAGNOSIS — I10 Essential (primary) hypertension: Secondary | ICD-10-CM

## 2019-07-03 DIAGNOSIS — E118 Type 2 diabetes mellitus with unspecified complications: Secondary | ICD-10-CM

## 2019-07-03 DIAGNOSIS — R200 Anesthesia of skin: Secondary | ICD-10-CM

## 2019-07-03 DIAGNOSIS — R202 Paresthesia of skin: Secondary | ICD-10-CM

## 2019-07-03 DIAGNOSIS — E1165 Type 2 diabetes mellitus with hyperglycemia: Secondary | ICD-10-CM

## 2019-07-03 LAB — POCT GLYCOSYLATED HEMOGLOBIN (HGB A1C): Hemoglobin A1C: 11.1 % — AB (ref 4.0–5.6)

## 2019-07-03 MED ORDER — ASPIRIN EC 81 MG PO TBEC: 81.0000 mg | DELAYED_RELEASE_TABLET | Freq: Every day | ORAL | Status: AC

## 2019-07-03 NOTE — Progress Notes (Signed)
Established Patient Office Visit     This visit occurred during the SARS-CoV-2 public health emergency.  Safety protocols were in place, including screening questions prior to the visit, additional usage of staff PPE, and extensive cleaning of exam room while observing appropriate contact time as indicated for disinfecting solutions.    CC/Reason for Visit: Follow-up diabetes  HPI: Christopher Mercado is a 43 y.o. male who is coming in today for the above mentioned reasons. Past Medical History is significant for: Uncontrolled diabetes with an A1c of 10.0 in June 2020.  He also has diabetic neuropathy.  He is here for routine diabetic follow-up.  He is still having numbness and tingling in his feet.  He supposed to be on [place] around 20 units but stopped taking about 2 months ago when he ran out and did not think to call us for refills.  He has not been routinely checking his blood sugars.  He is due for for an ophthalmology exam.  He is refusing flu or pneumococcal vaccinations today.   Past Medical/Surgical History: Past Medical History:  Diagnosis Date  . 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 Diabetes mellitus without complication (HCC)   . 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 Elevated LFTs   . Esophageal reflux   . Gastritis   . Gastroparesis    Seen by GI once - has "lived with it"  . Hypertension   . Meningitis, unspecified(322.9)    75-61 years of age  . Polyuria     Past Surgical History:  Procedure Laterality Date  . ACROMIO-CLAVICULAR JOINT REPAIR Right 03/08/2016   Procedure: RIGHT SHOULDER ACROMIOCLAVICULAR LIGAMENT REPAIR;  Surgeon: Teryl Lucy, MD;  Location: [facility];  Service: Orthopedics;  Laterality: Right;  . HERNIA REPAIR     6-72 years of age  . WISDOM TOOTH EXTRACTION      Social History:  reports that he has been smoking cigarettes. He has been smoking  about 0.50 packs per day. His smokeless tobacco use includes snuff. He reports current alcohol use. He reports that he does not use drugs.  Allergies: Allergies  Allergen Reactions  . Coconut Fatty Acids Hives, Shortness Of Breath and Swelling  . Prednisone Rash    Family History:  Family History  Problem Relation Age of Onset  . Prostate cancer Father   . Cancer Father   . Heart attack Father   . Heart disease Father   . Hypertension Mother   . CAD Mother   . Heart disease Mother   . Hyperlipidemia Maternal Grandfather   . Hypertension Maternal Grandfather   . Colon cancer Other        uncle  . Heart disease Other   . Liver disease Other      Current Outpatient Medications:  .  acetaminophen (TYLENOL) 500 MG tablet, Take 500-1,000 mg by mouth every 8 (eight) hours as needed for mild pain or headache. , Disp: , Rfl:  .  diphenhydrAMINE (BENADRYL) 25 mg capsule, Take 25 mg by mouth every 8 (eight) hours as needed for itching or allergies., Disp: , Rfl:  .  gabapentin (NEURONTIN) 300 MG capsule, Take 3 capsules (900 mg total) by mouth at bedtime., Disp: 270 capsule, Rfl: 0 .  insulin degludec (TRESIBA) 100 UNIT/ML SOPN FlexTouch Pen, Inject 0.24 mLs (24 Units total) into the skin daily., Disp: 3 pen, Rfl: 3 .  Insulin Pen Needle 29G X  12MM MISC, Use to inject [place], Disp: 100 each, Rfl: 3 .  metFORMIN (GLUCOPHAGE) 1000 MG tablet, Take 1 tablet (1,000 mg total) by mouth 2 (two) times daily with a meal., Disp: 180 tablet, Rfl: 0 .  aspirin EC 81 MG tablet, Take 1 tablet (81 mg total) by mouth daily., Disp:  , Rfl:   Review of Systems:  Constitutional: Denies fever, chills, diaphoresis, appetite change and fatigue.  HEENT: Denies photophobia, eye pain, redness, hearing loss, ear pain, congestion, sore throat, rhinorrhea, sneezing, mouth sores, trouble swallowing, neck pain, neck stiffness and tinnitus.   Respiratory: Denies SOB, DOE, cough, chest tightness,  and wheezing.     Cardiovascular: Denies chest pain, palpitations and leg swelling.  Gastrointestinal: Denies nausea, vomiting, abdominal pain, diarrhea, constipation, blood in stool and abdominal distention.  Genitourinary: Denies dysuria, urgency, frequency, hematuria, flank pain and difficulty urinating.  Endocrine: Denies: hot or cold intolerance, sweats, changes in hair or nails, polyuria, polydipsia. Musculoskeletal: Denies myalgias, back pain, joint swelling, arthralgias and gait problem.  Skin: Denies pallor, rash and wound.  Neurological: Denies dizziness, seizures, syncope, weakness, light-headedness and headaches.  Hematological: Denies adenopathy. Easy bruising, personal or family bleeding history  Psychiatric/Behavioral: Denies suicidal ideation, mood changes, confusion, nervousness, sleep disturbance and agitation    Physical Exam: Vitals:   07/03/19 0913  BP: 110/64  Pulse: 83  Temp: 97.9 F (36.6 C)  TempSrc: Temporal  SpO2: 97%  Weight: 187 lb 14.4 oz (85.2 kg)    Body mass index is 27.75 kg/m.   Constitutional: NAD, calm, comfortable Eyes: PERRL, lids and conjunctivae normal ENMT: Mucous membranes are moist.  Respiratory: clear to auscultation bilaterally, no wheezing, no crackles. Normal respiratory effort. No accessory muscle use.  Cardiovascular: Regular rate and rhythm, no murmurs / rubs / gallops. No extremity edema. 2+ pedal pulses.  Abdomen: no tenderness, no masses palpated. No hepatosplenomegaly. Bowel sounds positive.  Musculoskeletal: no clubbing / cyanosis. No joint deformity upper and lower extremities. Good ROM, no contractures. Normal muscle tone.  Skin: no rashes, lesions, ulcers. No induration Neurologic: Grossly intact and nonfocal Psychiatric: Normal judgment and insight. Alert and oriented x 3. Normal mood.   Diabetic Foot Exam - Simple   Simple Foot Form Diabetic Foot exam was performed with the following findings: Yes 07/03/2019  9:36 AM  Visual  Inspection No deformities, no ulcerations, no other skin breakdown bilaterally: Yes Sensation Testing See comments: Yes Pulse Check Posterior Tibialis and Dorsalis pulse intact bilaterally: Yes Comments Decreased vibration to big toes bilaterally      Impression and Plan:  Uncontrolled type 2 diabetes mellitus with complication, without long-term current use of insulin (HCC) -Unsurprisingly, not well controlled due to medication noncompliance. -Have given him samples of Basaglar from the office.  He will start at 20 units and increase by 3 units every third night that his fasting blood sugar is above 150. -He has been giving the names of the 5 long-acting insulins on the market for him to call his insurance company and find out which one has the lowest co-pay for him. -He also continues on Metformin 1000 mg twice daily. -For his neuropathy he continues Neurontin. -Start aspirin. -Check lipids today. -Diabetic foot exam performed today. -Referral to ophthalmology today.  Numbness and tingling/diabetic neuropathy - Plan: Vitamin B12, VITAMIN D 25 Hydroxy (Vit-D Deficiency, Fractures), TSH -Continue gabapentin.  Essential hypertension  -Well-controlled.    Patient Instructions  -Nice seeing you today!!  -Lab work today; will notify you once results  are available.  -Schedule 3 month follow up.  -Start back on insulin at 20 units at bedtime. Increase by 3 units every third night that your fasting blood sugar is above 150.   Diabetes Mellitus and Nutrition, Adult When you have diabetes (diabetes mellitus), it is very important to have healthy eating habits because your blood sugar (glucose) levels are greatly affected by what you eat and drink. Eating healthy foods in the appropriate amounts, at about the same times every day, can help you:  Control your blood glucose.  Lower your risk of heart disease.  Improve your blood pressure.  Reach or maintain a healthy  weight. Every person with diabetes is different, and each person has different needs for a meal plan. Your health care provider may recommend that you work with a diet and nutrition specialist (dietitian) to make a meal plan that is best for you. Your meal plan may vary depending on factors such as:  The calories you need.  The medicines you take.  Your weight.  Your blood glucose, blood pressure, and cholesterol levels.  Your activity level.  Other health conditions you have, such as heart or kidney disease. How do carbohydrates affect me? Carbohydrates, also called carbs, affect your blood glucose level more than any other type of food. Eating carbs naturally raises the amount of glucose in your blood. Carb counting is a method for keeping track of how many carbs you eat. Counting carbs is important to keep your blood glucose at a healthy level, especially if you use insulin or take certain oral diabetes medicines. It is important to know how many carbs you can safely have in each meal. This is different for every person. Your dietitian can help you calculate how many carbs you should have at each meal and for each snack. Foods that contain carbs include:  Bread, cereal, rice, pasta, and crackers.  Potatoes and corn.  Peas, beans, and lentils.  Milk and yogurt.  Fruit and juice.  Desserts, such as cakes, cookies, ice cream, and candy. How does alcohol affect me? Alcohol can cause a sudden decrease in blood glucose (hypoglycemia), especially if you use insulin or take certain oral diabetes medicines. Hypoglycemia can be a life-threatening condition. Symptoms of hypoglycemia (sleepiness, dizziness, and confusion) are similar to symptoms of having too much alcohol. If your health care provider says that alcohol is safe for you, follow these guidelines:  Limit alcohol intake to no more than 1 drink per day for nonpregnant women and 2 drinks per day for men. One drink equals 12 oz of  beer, 5 oz of wine, or 1 oz of hard liquor.  Do not drink on an empty stomach.  Keep yourself hydrated with water, diet soda, or unsweetened iced tea.  Keep in mind that regular soda, juice, and other mixers may contain a lot of sugar and must be counted as carbs. What are tips for following this plan?  Reading food labels  Start by checking the serving size on the "Nutrition Facts" label of packaged foods and drinks. The amount of calories, carbs, fats, and other nutrients listed on the label is based on one serving of the item. Many items contain more than one serving per package.  Check the total grams (g) of carbs in one serving. You can calculate the number of servings of carbs in one serving by dividing the total carbs by 15. For example, if a food has 30 g of total carbs, it would be equal  to 2 servings of carbs.  Check the number of grams (g) of saturated and trans fats in one serving. Choose foods that have low or no amount of these fats.  Check the number of milligrams (mg) of salt (sodium) in one serving. Most people should limit total sodium intake to less than 2,300 mg per day.  Always check the nutrition information of foods labeled as "low-fat" or "nonfat". These foods may be higher in added sugar or refined carbs and should be avoided.  Talk to your dietitian to identify your daily goals for nutrients listed on the label. Shopping  Avoid buying canned, premade, or processed foods. These foods tend to be high in fat, sodium, and added sugar.  Shop around the outside edge of the grocery store. This includes fresh fruits and vegetables, bulk grains, fresh meats, and fresh dairy. Cooking  Use low-heat cooking methods, such as baking, instead of high-heat cooking methods like deep frying.  Cook using healthy oils, such as olive, canola, or sunflower oil.  Avoid cooking with butter, cream, or high-fat meats. Meal planning  Eat meals and snacks regularly, preferably at  the same times every day. Avoid going long periods of time without eating.  Eat foods high in fiber, such as fresh fruits, vegetables, beans, and whole grains. Talk to your dietitian about how many servings of carbs you can eat at each meal.  Eat 4-6 ounces (oz) of lean protein each day, such as lean meat, chicken, fish, eggs, or tofu. One oz of lean protein is equal to: ? 1 oz of meat, chicken, or fish. ? 1 egg. ?  cup of tofu.  Eat some foods each day that contain healthy fats, such as avocado, nuts, seeds, and fish. Lifestyle  Check your blood glucose regularly.  Exercise regularly as told by your health care provider. This may include: ? 150 minutes of moderate-intensity or vigorous-intensity exercise each week. This could be brisk walking, biking, or water aerobics. ? Stretching and doing strength exercises, such as yoga or weightlifting, at least 2 times a week.  Take medicines as told by your health care provider.  Do not use any products that contain nicotine or tobacco, such as cigarettes and e-cigarettes. If you need help quitting, ask your health care provider.  Work with a Veterinary surgeoncounselor or diabetes educator to identify strategies to manage stress and any emotional and social challenges. Questions to ask a health care provider  Do I need to meet with a diabetes educator?  Do I need to meet with a dietitian?  What number can I call if I have questions?  When are the best times to check my blood glucose? Where to find more information:  American Diabetes Association: diabetes.org  Academy of Nutrition and Dietetics: www.eatright.[employer]  National Institute of Diabetes and Digestive and Kidney Diseases (NIH): CarFlippers.tnwww.niddk.nih.gov Summary  A healthy meal plan will help you control your blood glucose and maintain a healthy lifestyle.  Working with a diet and nutrition specialist (dietitian) can help you make a meal plan that is best for you.  Keep in mind that carbohydrates  (carbs) and alcohol have immediate effects on your blood glucose levels. It is important to count carbs and to use alcohol carefully. This information is not intended to replace advice given to you by your health care provider. Make sure you discuss any questions you have with your health care provider. Document Released: 03/24/2005 Document Revised: 06/09/2017 Document Reviewed: 08/01/2016 Elsevier Patient Education  2020 [employer]  Jaslynne Dahan Hernandez Acosta, MD [facility]   

## 2019-07-03 NOTE — Patient Instructions (Signed)
-Nice seeing you today!!  -Lab work today; will notify you once results are available.  -Schedule 3 month follow up.  -Start back on insulin at 20 units at bedtime. Increase by 3 units every third night that your fasting blood sugar is above 150.   Diabetes Mellitus and Nutrition, Adult When you have diabetes (diabetes mellitus), it is very important to have healthy eating habits because your blood sugar (glucose) levels are greatly affected by what you eat and drink. Eating healthy foods in the appropriate amounts, at about the same times every day, can help you:  Control your blood glucose.  Lower your risk of heart disease.  Improve your blood pressure.  Reach or maintain a healthy weight. Every person with diabetes is different, and each person has different needs for a meal plan. Your health care provider may recommend that you work with a diet and nutrition specialist (dietitian) to make a meal plan that is best for you. Your meal plan may vary depending on factors such as:  The calories you need.  The medicines you take.  Your weight.  Your blood glucose, blood pressure, and cholesterol levels.  Your activity level.  Other health conditions you have, such as heart or kidney disease. How do carbohydrates affect me? Carbohydrates, also called carbs, affect your blood glucose level more than any other type of food. Eating carbs naturally raises the amount of glucose in your blood. Carb counting is a method for keeping track of how many carbs you eat. Counting carbs is important to keep your blood glucose at a healthy level, especially if you use insulin or take certain oral diabetes medicines. It is important to know how many carbs you can safely have in each meal. This is different for every person. Your dietitian can help you calculate how many carbs you should have at each meal and for each snack. Foods that contain carbs include:  Bread, cereal, rice, pasta, and  crackers.  Potatoes and corn.  Peas, beans, and lentils.  Milk and yogurt.  Fruit and juice.  Desserts, such as cakes, cookies, ice cream, and candy. How does alcohol affect me? Alcohol can cause a sudden decrease in blood glucose (hypoglycemia), especially if you use insulin or take certain oral diabetes medicines. Hypoglycemia can be a life-threatening condition. Symptoms of hypoglycemia (sleepiness, dizziness, and confusion) are similar to symptoms of having too much alcohol. If your health care provider says that alcohol is safe for you, follow these guidelines:  Limit alcohol intake to no more than 1 drink per day for nonpregnant women and 2 drinks per day for men. One drink equals 12 oz of beer, 5 oz of wine, or 1 oz of hard liquor.  Do not drink on an empty stomach.  Keep yourself hydrated with water, diet soda, or unsweetened iced tea.  Keep in mind that regular soda, juice, and other mixers may contain a lot of sugar and must be counted as carbs. What are tips for following this plan?  Reading food labels  Start by checking the serving size on the "Nutrition Facts" label of packaged foods and drinks. The amount of calories, carbs, fats, and other nutrients listed on the label is based on one serving of the item. Many items contain more than one serving per package.  Check the total grams (g) of carbs in one serving. You can calculate the number of servings of carbs in one serving by dividing the total carbs by 15. For example, if  a food has 30 g of total carbs, it would be equal to 2 servings of carbs.  Check the number of grams (g) of saturated and trans fats in one serving. Choose foods that have low or no amount of these fats.  Check the number of milligrams (mg) of salt (sodium) in one serving. Most people should limit total sodium intake to less than 2,300 mg per day.  Always check the nutrition information of foods labeled as "low-fat" or "nonfat". These foods may be  higher in added sugar or refined carbs and should be avoided.  Talk to your dietitian to identify your daily goals for nutrients listed on the label. Shopping  Avoid buying canned, premade, or processed foods. These foods tend to be high in fat, sodium, and added sugar.  Shop around the outside edge of the grocery store. This includes fresh fruits and vegetables, bulk grains, fresh meats, and fresh dairy. Cooking  Use low-heat cooking methods, such as baking, instead of high-heat cooking methods like deep frying.  Cook using healthy oils, such as olive, canola, or sunflower oil.  Avoid cooking with butter, cream, or high-fat meats. Meal planning  Eat meals and snacks regularly, preferably at the same times every day. Avoid going long periods of time without eating.  Eat foods high in fiber, such as fresh fruits, vegetables, beans, and whole grains. Talk to your dietitian about how many servings of carbs you can eat at each meal.  Eat 4-6 ounces (oz) of lean protein each day, such as lean meat, chicken, fish, eggs, or tofu. One oz of lean protein is equal to: ? 1 oz of meat, chicken, or fish. ? 1 egg. ?  cup of tofu.  Eat some foods each day that contain healthy fats, such as avocado, nuts, seeds, and fish. Lifestyle  Check your blood glucose regularly.  Exercise regularly as told by your health care provider. This may include: ? 150 minutes of moderate-intensity or vigorous-intensity exercise each week. This could be brisk walking, biking, or water aerobics. ? Stretching and doing strength exercises, such as yoga or weightlifting, at least 2 times a week.  Take medicines as told by your health care provider.  Do not use any products that contain nicotine or tobacco, such as cigarettes and e-cigarettes. If you need help quitting, ask your health care provider.  Work with a Veterinary surgeon or diabetes educator to identify strategies to manage stress and any emotional and social  challenges. Questions to ask a health care provider  Do I need to meet with a diabetes educator?  Do I need to meet with a dietitian?  What number can I call if I have questions?  When are the best times to check my blood glucose? Where to find more information:  American Diabetes Association: diabetes.org  Academy of Nutrition and Dietetics: www.eatright.[employer] of Diabetes and Digestive and Kidney Diseases (NIH): CarFlippers.tn Summary  A healthy meal plan will help you control your blood glucose and maintain a healthy lifestyle.  Working with a diet and nutrition specialist (dietitian) can help you make a meal plan that is best for you.  Keep in mind that carbohydrates (carbs) and alcohol have immediate effects on your blood glucose levels. It is important to count carbs and to use alcohol carefully. This information is not intended to replace advice given to you by your health care provider. Make sure you discuss any questions you have with your health care provider. Document Released: 03/24/2005 Document  Revised: 06/09/2017 Document Reviewed: 08/01/2016 Elsevier Patient Education  [place].

## 2019-07-17 ENCOUNTER — Ambulatory Visit: Payer: Self-pay | Admitting: Internal Medicine

## 2019-07-30 ENCOUNTER — Encounter: Payer: Self-pay | Admitting: Internal Medicine

## 2019-08-05 ENCOUNTER — Encounter: Payer: Self-pay | Admitting: Internal Medicine

## 2019-08-06 ENCOUNTER — Telehealth: Payer: Self-pay | Admitting: Internal Medicine

## 2019-08-06 NOTE — Telephone Encounter (Signed)
Office or virtual visit?  

## 2019-08-06 NOTE — Telephone Encounter (Signed)
Virtual is ok.  

## 2019-08-06 NOTE — Telephone Encounter (Signed)
Pts spouse is calling in stating that pts blood sugars are still running very high 380 and 375 on yesterday and 340 something this morning and pt is stating that his body is feeling numb and do not want to walk.  Pt is on insulin and metformin and things is not seeming to change his blood sugars.  Pt would like to have advise exactly what to do at this time.

## 2019-08-06 NOTE — Telephone Encounter (Signed)
Attempted to call patient but voicemail is not set up yet.  Patient will need to schedule a virtual visit.

## 2019-08-07 ENCOUNTER — Other Ambulatory Visit: Payer: Self-pay

## 2019-08-07 ENCOUNTER — Telehealth (INDEPENDENT_AMBULATORY_CARE_PROVIDER_SITE_OTHER): Payer: Self-pay | Admitting: Internal Medicine

## 2019-08-07 DIAGNOSIS — E1140 Type 2 diabetes mellitus with diabetic neuropathy, unspecified: Secondary | ICD-10-CM

## 2019-08-07 DIAGNOSIS — E1142 Type 2 diabetes mellitus with diabetic polyneuropathy: Secondary | ICD-10-CM

## 2019-08-07 DIAGNOSIS — Z794 Long term (current) use of insulin: Secondary | ICD-10-CM

## 2019-08-07 MED ORDER — INSULIN DEGLUDEC 100 UNIT/ML ~~LOC~~ SOPN: 24.0000 [IU] | PEN_INJECTOR | Freq: Every day | SUBCUTANEOUS | 3 refills | Status: DC

## 2019-08-07 NOTE — Telephone Encounter (Signed)
Appointment scheduled.

## 2019-08-07 NOTE — Progress Notes (Signed)
Virtual Visit via Telephone Note  I connected with Christopher Mercado on 08/07/19 at  2:45 PM EST by telephone and verified that I am speaking with the correct person using two identifiers.   I discussed the limitations, risks, security and privacy concerns of performing an evaluation and management service by telephone and the availability of in person appointments. I also discussed with the patient that there may be a patient responsible charge related to this service. The patient expressed understanding and agreed to proceed.  Location patient: home Location provider: work office Participants present for the call: patient, provider Patient did not have a visit in the prior 7 days to address this/these issue(s).   History of Present Illness:  He has scheduled this visit to discuss elevated blood sugars.  For the past few days they have been above 400.  At last visit he had been noncompliant with his insulin and Metformin.  At that time we started him back on Metformin 1000 mg twice daily and Basaglar 20 units at bedtime with instructions to increase by 3 units every third night that his fasting blood sugar was above 150.  By my count, he should have run out of the sample pen by now.  He has also not been titrating as he is still at 20 units despite titration instructions.   Observations/Objective: Patient sounds cheerful and well on the phone. I do not appreciate any increased work of breathing. Speech and thought processing are grossly intact. Patient reported vitals: None reported   Current Outpatient Medications:  .  acetaminophen (TYLENOL) 500 MG tablet, Take 500-1,000 mg by mouth every 8 (eight) hours as needed for mild pain or headache. , Disp: , Rfl:  .  aspirin EC 81 MG tablet, Take 1 tablet (81 mg total) by mouth daily., Disp:  , Rfl:  .  diphenhydrAMINE (BENADRYL) 25 mg capsule, Take 25 mg by mouth every 8 (eight) hours as needed for itching or allergies., Disp: , Rfl:    .  gabapentin (NEURONTIN) 300 MG capsule, Take 3 capsules (900 mg total) by mouth at bedtime., Disp: 270 capsule, Rfl: 0 .  insulin degludec (TRESIBA) 100 UNIT/ML SOPN FlexTouch Pen, Inject 0.24 mLs (24 Units total) into the skin daily., Disp: 3 pen, Rfl: 3 .  Insulin Pen Needle 29G X MISC, Use to inject [place], Disp: 100 each, Rfl: 3 .  metFORMIN (GLUCOPHAGE) 1000 MG tablet, Take 1 tablet (1,000 mg total) by mouth 2 (two) times daily with a meal., Disp: 180 tablet, Rfl: 0  Review of Systems:  Constitutional: Denies fever, chills, diaphoresis, appetite change and fatigue.  HEENT: Denies photophobia, eye pain, redness, hearing loss, ear pain, congestion, sore throat, rhinorrhea, sneezing, mouth sores, trouble swallowing, neck pain, neck stiffness and tinnitus.   Respiratory: Denies SOB, DOE, cough, chest tightness,  and wheezing.   Cardiovascular: Denies chest pain, palpitations and leg swelling.  Gastrointestinal: Denies nausea, vomiting, abdominal pain, diarrhea, constipation, blood in stool and abdominal distention.  Genitourinary: Denies dysuria, urgency, frequency, hematuria, flank pain and difficulty urinating.  Endocrine: Denies: hot or cold intolerance, sweats, changes in hair or nails, polyuria, polydipsia. Musculoskeletal: Denies myalgias, back pain, joint swelling, arthralgias and gait problem.  Skin: Denies pallor, rash and wound.  Neurological: Denies dizziness, seizures, syncope, weakness, light-headedness, numbness and headaches.  Hematological: Denies adenopathy. Easy bruising, personal or family bleeding history  Psychiatric/Behavioral: Denies suicidal ideation, mood changes, confusion, nervousness, sleep disturbance and agitation   Assessment and Plan:  Type 2 diabetes mellitus with diabetic polyneuropathy, with long-term current use of insulin ([place]) -Unfortunately, we do not have any samples in office that we can provide him with. -Will send new prescription of  Basaglar, he is able to repeat titration instructions to me, will follow up with him in 8 weeks.    I discussed the assessment and treatment plan with the patient. The patient was provided an opportunity to ask questions and all were answered. The patient agreed with the plan and demonstrated an understanding of the instructions.   The patient was advised to call back or seek an in-person evaluation if the symptoms worsen or if the condition fails to improve as anticipated.  I provided 15 minutes of non-face-to-face time during this encounter.   Lelon Frohlich, MD [facility]

## 2019-08-09 ENCOUNTER — Telehealth: Payer: Self-pay | Admitting: Internal Medicine

## 2019-08-09 DIAGNOSIS — E1140 Type 2 diabetes mellitus with diabetic neuropathy, unspecified: Secondary | ICD-10-CM

## 2019-08-09 NOTE — Telephone Encounter (Signed)
Referral placed.

## 2019-08-09 NOTE — Telephone Encounter (Signed)
per father call, frank Ottinger  said the  pt sugar levels  are  reading 368  and  380; wanted to know if the Dr can referrer him to endocrinologist Dr. Elvera Lennox, [employer]  call back number  [phone] [name]

## 2019-08-13 ENCOUNTER — Encounter: Payer: Self-pay | Admitting: Internal Medicine

## 2019-09-19 ENCOUNTER — Ambulatory Visit: Payer: Self-pay | Admitting: Internal Medicine

## 2019-09-19 ENCOUNTER — Other Ambulatory Visit: Payer: Self-pay

## 2019-09-19 ENCOUNTER — Encounter: Payer: Self-pay | Admitting: Internal Medicine

## 2019-09-19 VITALS — BP 104/80 | HR 102 | Temp 97.6°F | Ht 66.0 in | Wt 186.8 lb

## 2019-09-19 DIAGNOSIS — E1140 Type 2 diabetes mellitus with diabetic neuropathy, unspecified: Secondary | ICD-10-CM

## 2019-09-19 DIAGNOSIS — E119 Type 2 diabetes mellitus without complications: Secondary | ICD-10-CM

## 2019-09-19 LAB — POCT GLYCOSYLATED HEMOGLOBIN (HGB A1C): Hemoglobin A1C: 12.1 % — AB (ref 4.0–5.6)

## 2019-09-19 LAB — GLUCOSE, POCT (MANUAL RESULT ENTRY): POC Glucose: 392 mg/dl — AB (ref 70–99)

## 2019-09-19 MED ORDER — "INSULIN SYRINGE-NEEDLE U-100 30G X 5/16"" 0.3 ML MISC": 1.0000 | Freq: Two times a day (BID) | 11 refills | Status: AC

## 2019-09-19 MED ORDER — METFORMIN HCL 1000 MG PO TABS: 1000.0000 mg | ORAL_TABLET | Freq: Two times a day (BID) | ORAL | 3 refills | Status: DC

## 2019-09-19 NOTE — Patient Instructions (Addendum)
-   Start Metformin 1000 mg Twice daily with meal  - Start ReliOn- N 8 units in the morning and 8 units at Bedtime   - Contact us if your sugars are consistently over 250 or less then 80 mg/dL   - Try to check your sugar before breakfast and bedtime      HOW TO TREAT LOW BLOOD SUGARS (Blood sugar LESS THAN 70 MG/DL)  Please follow the RULE OF 15 for the treatment of hypoglycemia treatment (when your (blood sugars are less than 70 mg/dL)    STEP 1: Take 15 grams of carbohydrates when your blood sugar is low, which includes:   3-4 GLUCOSE TABS  OR  3-4 OZ OF JUICE OR REGULAR SODA OR  ONE TUBE OF GLUCOSE GEL     STEP 2: RECHECK blood sugar in 15 MINUTES STEP 3: If your blood sugar is still low at the 15 minute recheck --> then, go back to STEP 1 and treat AGAIN with another 15 grams of carbohydrates.

## 2019-09-19 NOTE — Progress Notes (Signed)
Name: Christopher Mercado  MRN/ DOB: [id number], 1975/12/24   Age/ Sex: 44 y.o., male    PCP: Isaac Bliss, Rayford Halsted, MD   Reason for Endocrinology Evaluation: Type 2 Diabetes Mellitus     Date of Initial Endocrinology Visit: 09/19/2019     PATIENT IDENTIFIER: Christopher Mercado is a 44 y.o. male with a past medical history of T2DM, Amphetamine use. The patient presented for initial endocrinology clinic visit on 09/19/2019 for consultative assistance with his diabetes management.    HPI: [name] is accompanied by his wife   Diagnosed with DM in 2017 Prior Medications tried/Intolerance: as listed Currently checking blood sugars 1 x / week Hypoglycemia episodes : no     Hemoglobin A1c has ranged from 8.9% in 2017, peaking at 11.8% in 2020. Patient required assistance for hypoglycemia: no Patient has required hospitalization within the last 1 year from hyper or hypoglycemia: no  In terms of diet, the patient eats 3 meals , snacks 3 x a day.   HOME DIABETES REGIMEN: Metformin 1000 mg BID - does not take  it for weeks at a time  [place] - last time took insulin a couple of weeks ago    Statin: no ACE-I/ARB: no Prior Diabetic Education: no   METER DOWNLOAD SUMMARY: Did not bring    DIABETIC COMPLICATIONS: Microvascular complications:   Neuropathy  Denies: CKD  Last eye exam: Completed years ago   Macrovascular complications:    Denies: CAD, PVD, CVA   PAST HISTORY: Past Medical History:  Past Medical History:  Diagnosis Date  . 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 Diabetes mellitus without complication ([place])   . 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 Elevated LFTs   . Esophageal reflux   . Gastritis   . Gastroparesis    Seen by GI once - has "lived with it"  . Hypertension   . Meningitis, unspecified(322.9)    56-106 years of age  . Polyuria    Past Surgical  History:  Past Surgical History:  Procedure Laterality Date  . ACROMIO-CLAVICULAR JOINT REPAIR Right 03/08/2016   Procedure: RIGHT SHOULDER ACROMIOCLAVICULAR LIGAMENT REPAIR;  Surgeon: Marchia Bond, MD;  Location: [facility];  Service: Orthopedics;  Laterality: Right;  . HERNIA REPAIR     91-9 years of age  . WISDOM TOOTH EXTRACTION        Social History:  reports that he has been smoking cigarettes. He has been smoking about 0.50 packs per day. His smokeless tobacco use includes snuff. He reports current alcohol use. He reports that he does not use drugs. Family History:  Family History  Problem Relation Age of Onset  . Prostate cancer Father   . Cancer Father   . Heart attack Father   . Heart disease Father   . Hypertension Mother   . CAD Mother   . Heart disease Mother   . Hyperlipidemia Maternal Grandfather   . Hypertension Maternal Grandfather   . Colon cancer Other        uncle  . Heart disease Other   . Liver disease Other      HOME MEDICATIONS: Allergies as of 09/19/2019      Reactions   Coconut Fatty Acids Hives, Shortness Of Breath, Swelling   Prednisone Rash      Medication List       Accurate as of September 19, 2019 10:50 AM. If you have any questions,  ask your nurse or doctor.        STOP taking these medications   insulin degludec 100 UNIT/ML FlexTouch Pen Commonly known as: TRESIBA Stopped by: Scarlette Shorts, MD     TAKE these medications   acetaminophen 500 MG tablet Commonly known as: TYLENOL Take 500-1,000 mg by mouth every 8 (eight) hours as needed for mild pain or headache.   aspirin EC 81 MG tablet Take 1 tablet (81 mg total) by mouth daily.   diphenhydrAMINE 25 mg capsule Commonly known as: BENADRYL Take 25 mg by mouth every 8 (eight) hours as needed for itching or allergies.   gabapentin 300 MG capsule Commonly known as: NEURONTIN Take 3 capsules (900 mg total) by mouth at bedtime.   Insulin Pen Needle 29G X  Misc Use to inject [place]   Insulin Syringe-Needle U-100 30G X 5/16" 0.3 ML Misc Commonly known as: RELION INSULIN SYR 0.3CC/30G 1 Device by Does not apply route 2 (two) times daily with a meal. Started by: Scarlette Shorts, MD   metFORMIN 1000 MG tablet Commonly known as: GLUCOPHAGE Take 1 tablet (1,000 mg total) by mouth 2 (two) times daily with a meal.        ALLERGIES: Allergies  Allergen Reactions  . Coconut Fatty Acids Hives, Shortness Of Breath and Swelling  . Prednisone Rash     REVIEW OF SYSTEMS: A comprehensive ROS was conducted with the patient and is negative except as per HPI and below:  Review of Systems  Gastrointestinal: Negative for diarrhea and nausea.  Neurological: Positive for tingling.      OBJECTIVE:   VITAL SIGNS: BP 104/80 (BP Location: Right Arm, Patient Position: Sitting, Cuff Size: Large)   Pulse (!) 102   Temp 97.6 F (36.4 C)   Ht 5\' 6"  (1.676 m)   Wt 186 lb 12.8 oz (84.7 kg)   SpO2 98%   BMI 30.15 kg/m    PHYSICAL EXAM:  General: Pt appears well and is in NAD  Neck: General: Supple without adenopathy or carotid bruits. Thyroid: Thyroid size normal.  No goiter or nodules appreciated. No thyroid bruit.  Lungs: Clear with good BS bilat with no rales, rhonchi, or wheezes  Heart: RRR with normal S1 and S2 and no gallops; no murmurs; no rub  Abdomen: Normoactive bowel sounds, soft, nontender, without masses or organomegaly palpable  Extremities:  Lower extremities - No pretibial edema. No lesions.  Skin: Normal texture and temperature to palpation. No rash noted.   Neuro: MS is good with appropriate affect, pt is alert and Ox3    DM foot exam: deferred   DATA REVIEWED:  Lab Results  Component Value Date   HGBA1C 12.1 (A) 09/19/2019   HGBA1C 11.1 (A) 07/03/2019   HGBA1C 10.1 (H) 12/31/2018   Lab Results  Component Value Date   LDLCALC 120 (H) 03/06/2018   CREATININE 0.72 12/30/2018   No results found for:  [facility]  Lab Results  Component Value Date   CHOL 202 (H) 03/06/2018   HDL 68.10 03/06/2018   LDLCALC 120 (H) 03/06/2018   TRIG 68.0 03/06/2018   CHOLHDL 3 03/06/2018        ASSESSMENT / PLAN / RECOMMENDATIONS:   1) Type 2 Diabetes Mellitus, Poorly controlled, With Neuropathic complications - Most recent A1c of 12.1 %. Goal A1c < 7.0 %.    -Poorly controlled diabetes due to medication nonadherence and dietary indiscretions. -I have advised him to avoid sugar sweetened beverages, and to  avoid snacks if possible, we did discuss low-carb snacks. -Patient assures me he has not used any kind of drugs recently, we did discuss that addiction is a major barrier to diabetes self-care. -His other barrier is lack of health insurance, patient will get insulin and Metformin from [facility] pharmacy. -We will start him on NPH insulin, he was instructed to take this in the morning and again at bedtime, I have advised him not to use it at dinnertime as it will increase his risk of hypoglycemia overnight. - I have discussed with the patient the pathophysiology of diabetes. We went over the natural progression of the disease. We talked about both insulin resistance and insulin deficiency. We stressed the importance of lifestyle changes including diet and exercise. I explained the complications associated with diabetes including retinopathy, nephropathy, neuropathy as well as increased risk of cardiovascular disease. We went over the benefit seen with glycemic control.   -I explained to the patient that diabetic patients are at higher than normal risk for amputations   MEDICATIONS: - Start Metformin 1000 mg Twice daily with meal  - Start ReliOn- N 8 units in the morning and 8 units at Bedtime    EDUCATION / INSTRUCTIONS:  BG monitoring instructions: Patient is instructed to check his blood sugars 2 times a day, fasting and bedtime.  Call [facility] if: BG persistently < 70 or >  300. . I reviewed the Rule of 15 for the treatment of hypoglycemia in detail with the patient. Literature supplied.   2) Diabetic complications:   Eye: Unknown to have diabetic retinopathy.  I have encouraged the patient to have an eye exam  Neuro/ Feet: Does  have known diabetic peripheral neuropathy.  Renal: Patient does not have known baseline CKD. He is not on an ACEI/ARB at present.he is not up-to-date on microalbumin check.   3) Lipids: Patient is not on a statin.  Last lipid profile check was in 2019, patient does qualify to be on statins due to his age and LDL level.  We will discuss this on next visit   Follow-up in 3 months    Signed electronically by: Lyndle Herrlich, MD  [facility] End[facility]  [facility] [address] [place] [place], [place] [address] Phone: [phone] FAX: [phone]   CC: Philip Aspen, Limmie Patricia, MD [address] [place] [address] Phone: [phone]  Fax: [phone]    Return to Endocrinology clinic as below: Future Appointments  Date Time Provider Department Center  12/24/2019  8:10 AM Bailee Metter, Konrad Dolores, MD [id number] None

## 2019-10-07 ENCOUNTER — Encounter: Payer: Self-pay | Admitting: Internal Medicine

## 2019-11-02 ENCOUNTER — Encounter: Payer: Self-pay | Admitting: Internal Medicine

## 2019-11-18 ENCOUNTER — Other Ambulatory Visit: Payer: Self-pay | Admitting: Internal Medicine

## 2019-11-18 ENCOUNTER — Encounter: Payer: Self-pay | Admitting: Internal Medicine

## 2019-11-18 DIAGNOSIS — E1140 Type 2 diabetes mellitus with diabetic neuropathy, unspecified: Secondary | ICD-10-CM

## 2019-11-19 MED ORDER — GABAPENTIN 300 MG PO CAPS: 900.0000 mg | ORAL_CAPSULE | Freq: Every day | ORAL | 0 refills | Status: DC

## 2019-12-03 ENCOUNTER — Telehealth (INDEPENDENT_AMBULATORY_CARE_PROVIDER_SITE_OTHER): Payer: Self-pay | Admitting: Internal Medicine

## 2019-12-03 DIAGNOSIS — E1142 Type 2 diabetes mellitus with diabetic polyneuropathy: Secondary | ICD-10-CM

## 2019-12-03 DIAGNOSIS — Z794 Long term (current) use of insulin: Secondary | ICD-10-CM

## 2019-12-03 DIAGNOSIS — E1140 Type 2 diabetes mellitus with diabetic neuropathy, unspecified: Secondary | ICD-10-CM

## 2019-12-03 MED ORDER — GABAPENTIN 300 MG PO CAPS: 1200.0000 mg | ORAL_CAPSULE | Freq: Every day | ORAL | 2 refills | Status: DC

## 2019-12-03 MED ORDER — METFORMIN HCL 1000 MG PO TABS: 1000.0000 mg | ORAL_TABLET | Freq: Two times a day (BID) | ORAL | 1 refills | Status: DC

## 2019-12-03 NOTE — Progress Notes (Signed)
Virtual Visit via Video Note  I connected with Christopher Mercado on 12/03/19 at  1:30 PM EDT by a video enabled telemedicine application and verified that I am speaking with the correct person using two identifiers.  Location patient: home Location provider: work office Persons participating in the virtual visit: patient, provider  I discussed the limitations of evaluation and management by telemedicine and the availability of in person appointments. The patient expressed understanding and agreed to proceed.   HPI: He has made this appointment mainly to discuss his diabetic neuropathy.  He started seeing an endocrinologist in March at my insistence due to me having difficulty controlling his diabetes  mainly due to  noncompliance.  He states he has been compliant with Metformin and NPH.  Is requesting Metformin refills today.  His main issue is progression of diabetic neuropathy.  He is currently taking gabapentin 900 mg at bedtime.   ROS: Constitutional: Denies fever, chills, diaphoresis, appetite change and fatigue.  HEENT: Denies photophobia, eye pain, redness, hearing loss, ear pain, congestion, sore throat, rhinorrhea, sneezing, mouth sores, trouble swallowing, neck pain, neck stiffness and tinnitus.   Respiratory: Denies SOB, DOE, cough, chest tightness,  and wheezing.   Cardiovascular: Denies chest pain, palpitations and leg swelling.  Gastrointestinal: Denies nausea, vomiting, abdominal pain, diarrhea, constipation, blood in stool and abdominal distention.  Genitourinary: Denies dysuria, urgency, frequency, hematuria, flank pain and difficulty urinating.  Endocrine: Denies: hot or cold intolerance, sweats, changes in hair or nails, polyuria, polydipsia. Musculoskeletal: Denies myalgias, back pain, joint swelling, arthralgias and gait problem.  Skin: Denies pallor, rash and wound.  Neurological: Denies dizziness, seizures, syncope, weakness, light-headedness, numbness and  headaches.  Hematological: Denies adenopathy. Easy bruising, personal or family bleeding history  Psychiatric/Behavioral: Denies suicidal ideation, mood changes, confusion, nervousness, sleep disturbance and agitation   Past Medical History:  Diagnosis Date  . 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 Diabetes mellitus without complication ([place])   . 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 Elevated LFTs   . Esophageal reflux   . Gastritis   . Gastroparesis    Seen by GI once - has "lived with it"  . Hypertension   . Meningitis, unspecified(322.9)    55-75 years of age  . Polyuria     Past Surgical History:  Procedure Laterality Date  . ACROMIO-CLAVICULAR JOINT REPAIR Right 03/08/2016   Procedure: RIGHT SHOULDER ACROMIOCLAVICULAR LIGAMENT REPAIR;  Surgeon: Marchia Bond, MD;  Location: [place];  Service: Orthopedics;  Laterality: Right;  . HERNIA REPAIR     17-6 years of age  . WISDOM TOOTH EXTRACTION      Family History  Problem Relation Age of Onset  . Prostate cancer Father   . Cancer Father   . Heart attack Father   . Heart disease Father   . Hypertension Mother   . CAD Mother   . Heart disease Mother   . Hyperlipidemia Maternal Grandfather   . Hypertension Maternal Grandfather   . Colon cancer Other        uncle  . Heart disease Other   . Liver disease Other     SOCIAL HX:   reports that he has been smoking cigarettes. He has been smoking about 0.50 packs per day. His smokeless tobacco use includes snuff. He reports current alcohol use. He reports that he does not use drugs.   Current Outpatient Medications:  .  acetaminophen (TYLENOL) 500  MG tablet, Take 500-1,000 mg by mouth every 8 (eight) hours as needed for mild pain or headache. , Disp: , Rfl:  .  aspirin EC 81 MG tablet, Take 1 tablet (81 mg total) by mouth daily., Disp:  , Rfl:  .  diphenhydrAMINE (BENADRYL) 25 mg capsule,  Take 25 mg by mouth every 8 (eight) hours as needed for itching or allergies., Disp: , Rfl:  .  gabapentin (NEURONTIN) 300 MG capsule, Take 4 capsules (1,200 mg total) by mouth at bedtime., Disp: 120 capsule, Rfl: 2 .  Insulin Pen Needle 29G X MISC, Use to inject [place], Disp: 100 each, Rfl: 3 .  Insulin Syringe-Needle U-100 (RELION INSULIN SYR 0.3CC/30G) 30G X 5/16" 0.3 ML MISC, 1 Device by Does not apply route 2 (two) times daily with a meal., Disp: 50 each, Rfl: 11 .  metFORMIN (GLUCOPHAGE) 1000 MG tablet, Take 1 tablet (1,000 mg total) by mouth 2 (two) times daily with a meal., Disp: 180 tablet, Rfl: 1  EXAM:   VITALS per patient if applicable: None reported  GENERAL: alert, oriented, appears well and in no acute distress  HEENT: atraumatic, conjunttiva clear, no obvious abnormalities on inspection of external nose and ears  NECK: normal movements of the head and neck  LUNGS: on inspection no signs of respiratory distress, breathing rate appears normal, no obvious gross increased work of breathing, gasping or wheezing  CV: no obvious cyanosis  MS: moves all visible extremities without noticeable abnormality  PSYCH/NEURO: pleasant and cooperative, no obvious depression or anxiety, speech and thought processing grossly intact  ASSESSMENT AND PLAN:   Type 2 diabetes mellitus with diabetic polyneuropathy, with long-term current use of insulin ([facility])  -Increase gabapentin dose to 1200 mg at bedtime. -Advised that good diabetic control is paramount to avoid progression of neuropathy and the complications that come with that including risk of amputation. -I have refilled his Metformin today.  Have asked him to come in office for his next appointment    I discussed the assessment and treatment plan with the patient. The patient was provided an opportunity to ask questions and all were answered. The patient agreed with the plan and demonstrated an understanding of the instructions.    The patient was advised to call back or seek an in-person evaluation if the symptoms worsen or if the condition fails to improve as anticipated.    Chaya Jan, MD  [facility] at [facility]

## 2019-12-23 ENCOUNTER — Ambulatory Visit (INDEPENDENT_AMBULATORY_CARE_PROVIDER_SITE_OTHER): Payer: Medicaid Other | Admitting: Internal Medicine

## 2019-12-23 ENCOUNTER — Encounter: Payer: Self-pay | Admitting: Internal Medicine

## 2019-12-23 ENCOUNTER — Other Ambulatory Visit: Payer: Self-pay

## 2019-12-23 VITALS — BP 128/86 | HR 111 | Ht 66.0 in | Wt 175.6 lb

## 2019-12-23 DIAGNOSIS — E1140 Type 2 diabetes mellitus with diabetic neuropathy, unspecified: Secondary | ICD-10-CM

## 2019-12-23 LAB — POCT GLYCOSYLATED HEMOGLOBIN (HGB A1C): Hemoglobin A1C: 9.5 % — AB (ref 4.0–5.6)

## 2019-12-23 LAB — GLUCOSE, POCT (MANUAL RESULT ENTRY): POC Glucose: 325 mg/dl — AB (ref 70–99)

## 2019-12-23 MED ORDER — INSULIN NPH (HUMAN) (ISOPHANE) 100 UNIT/ML ~~LOC~~ SUSP: 22.0000 [IU] | Freq: Two times a day (BID) | SUBCUTANEOUS | 11 refills | Status: DC

## 2019-12-23 NOTE — Patient Instructions (Addendum)
-   Keep up the GOOD WORK !!  - Continue Metformin 1000 mg Twice daily with meal  - Continue ReliOn- N 22 units in the morning an 22 units at Bedtime (DO NOT INcrease this please )      HOW TO TREAT LOW BLOOD SUGARS (Blood sugar LESS THAN 70 MG/DL)  Please follow the RULE OF 15 for the treatment of hypoglycemia treatment (when your (blood sugars are less than 70 mg/dL)    STEP 1: Take 15 grams of carbohydrates when your blood sugar is low, which includes:   3-4 GLUCOSE TABS  OR  3-4 OZ OF JUICE OR REGULAR SODA OR  ONE TUBE OF GLUCOSE GEL     STEP 2: RECHECK blood sugar in 15 MINUTES STEP 3: If your blood sugar is still low at the 15 minute recheck --> then, go back to STEP 1 and treat AGAIN with another 15 grams of carbohydrates.

## 2019-12-23 NOTE — Progress Notes (Signed)
Name: Christopher Mercado  Age/ Sex: 44 y.o., male   MRN/ DOB: [id number], June 11, 1976     PCP: Philip Aspen, Limmie Patricia, MD   Reason for Endocrinology Evaluation: Type 2 Diabetes Mellitus  Initial Endocrine Consultative Visit: 09/19/2019    PATIENT IDENTIFIER: Christopher Mercado is a 44 y.o. male with a past medical history of T2Dn. The patient has followed with Endocrinology clinic since 09/19/2019 for consultative assistance with management of his diabetes.  DIABETIC HISTORY:  Christopher Mercado was diagnosed with DM in 2017, he has been on metformin and tresiba since his diagnosis. His hemoglobin A1c has ranged from 8.9% in 2017, peaking at 11.8% in 2020.  On his initial visit to our clinic, his A1c was 12.1%  he was on Metformin and Evaristo Bury but was not taking the tresiba due to cost . We continued metformin  And switched Tresiba to Novolin-N  SUBJECTIVE:   During the last visit (09/19/2019): A1c 12.1% Continued metformin and switched tresiba to Novolin-N   Today (12/23/2019): Christopher Mercado  He checks his blood sugars 2 times daily. The patient has not had hypoglycemic episodes since the last clinic visit. Somehow his insulin dose has increased from 8 units to 22 units.    HOME DIABETES REGIMEN:  Metformin 1000 mg BID  RelinOn - N 22 units BID       Statin: no ACE-I/ARB: no    METER DOWNLOAD SUMMARY: Did not bring a meter     DIABETIC COMPLICATIONS: Microvascular complications:   Neuropathy  Denies: CKD, retinopathy   Last eye exam: Completed 11/2019    Macrovascular complications:    Denies: CAD, PVD, CVA   HISTORY:  Past Medical History:  Past Medical History:  Diagnosis Date  . 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 Diabetes mellitus without complication (HCC)   . 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 Elevated LFTs   . Esophageal reflux   . Gastritis   . Gastroparesis     Seen by GI once - has "lived with it"  . Hypertension   . Meningitis, unspecified(322.9)    31-8 years of age  . Polyuria    Past Surgical History:  Past Surgical History:  Procedure Laterality Date  . ACROMIO-CLAVICULAR JOINT REPAIR Right 03/08/2016   Procedure: RIGHT SHOULDER ACROMIOCLAVICULAR LIGAMENT REPAIR;  Surgeon: Teryl Lucy, MD;  Location: [facility];  Service: Orthopedics;  Laterality: Right;  . HERNIA REPAIR     82-51 years of age  . WISDOM TOOTH EXTRACTION      Social History:  reports that he has been smoking cigarettes. He has been smoking about 0.50 packs per day. His smokeless tobacco use includes snuff. He reports current alcohol use. He reports that he does not use drugs. Family History:  Family History  Problem Relation Age of Onset  . Prostate cancer Father   . Cancer Father   . Heart attack Father   . Heart disease Father   . Hypertension Mother   . CAD Mother   . Heart disease Mother   . Hyperlipidemia Maternal Grandfather   . Hypertension Maternal Grandfather   . Colon cancer Other        uncle  . Heart disease Other   . Liver disease Other      HOME MEDICATIONS: Allergies as of 12/23/2019      Reactions   Coconut Fatty Acids Hives, Shortness Of Breath, Swelling   Prednisone Rash  Medication List       Accurate as of December 23, 2019 10:13 AM. If you have any questions, ask your nurse or doctor.        acetaminophen 500 MG tablet Commonly known as: TYLENOL Take 500-1,000 mg by mouth every 8 (eight) hours as needed for mild pain or headache.   aspirin EC 81 MG tablet Take 1 tablet (81 mg total) by mouth daily.   diphenhydrAMINE 25 mg capsule Commonly known as: BENADRYL Take 25 mg by mouth every 8 (eight) hours as needed for itching or allergies.   gabapentin 300 MG capsule Commonly known as: NEURONTIN Take 4 capsules (1,200 mg total) by mouth at bedtime.   Insulin Pen Needle 29G X 12MM Misc Use to inject [place]    Insulin Syringe-Needle U-100 30G X 5/16" 0.3 ML Misc Commonly known as: RELION INSULIN SYR 0.3CC/30G 1 Device by Does not apply route 2 (two) times daily with a meal. What changed: how much to take   metFORMIN 1000 MG tablet Commonly known as: GLUCOPHAGE Take 1 tablet (1,000 mg total) by mouth 2 (two) times daily with a meal.        OBJECTIVE:   Vital Signs: BP 128/86 (BP Location: Left Arm, Patient Position: Sitting, Cuff Size: Normal)   Pulse (!) 111   Ht 5\' 6"  (1.676 m)   Wt 175 lb 9.6 oz (79.7 kg)   SpO2 98%   BMI 28.34 kg/m   Wt Readings from Last 3 Encounters:  12/23/19 175 lb 9.6 oz (79.7 kg)  09/19/19 186 lb 12.8 oz (84.7 kg)  07/03/19 187 lb 14.4 oz (85.2 kg)     Exam: General: Pt appears well and is in NAD  Lungs: Clear with good BS bilat with no rales, rhonchi, or wheezes  Heart: RRR with normal S1 and S2 and no gallops; no murmurs; no rub  Abdomen: Normoactive bowel sounds, soft, nontender, without masses or organomegaly palpable  Extremities: No pretibial edema.   Neuro: MS is good with appropriate affect, pt is alert and Ox3    DM foot exam:  12/23/2019 The skin of the feet is without sores or ulcerations, plantar callous formation noted  The pedal pulses are 2+ on right and 2+ on left. The sensation is decreased to a screening 5.07, 10 gram monofilament bilaterally        DATA REVIEWED:  Lab Results  Component Value Date   HGBA1C 9.5 (A) 12/23/2019   HGBA1C 12.1 (A) 09/19/2019   HGBA1C 11.1 (A) 07/03/2019   Lab Results  Component Value Date   LDLCALC 120 (H) 03/06/2018   CREATININE 0.72 12/30/2018     Lab Results  Component Value Date   CHOL 202 (H) 03/06/2018   HDL 68.10 03/06/2018   LDLCALC 120 (H) 03/06/2018   TRIG 68.0 03/06/2018   CHOLHDL 3 03/06/2018         ASSESSMENT / PLAN / RECOMMENDATIONS:   1) Type 2 Diabetes Mellitus, with improved Glycemic control , With Neuropathic complications - Most recent A1c of 9.5 %. Goal  A1c < 7.0 %.    - I have praised the pt on his improved glycemic control, I have again discussed avoiding sugar-sweetened beverages. - This morning for breakfast he had a tenderloin biscuit and regular soda, BG was 325 mg/dL in the office, this is most likely due to regular soda.  - I have discouraged him from increasing the insulin any further  - No changes will be made today  MEDICATIONS:  Continue Metformin 1000 mg BID   NPH 22 units BID   EDUCATION / INSTRUCTIONS:  BG monitoring instructions: Patient is instructed to check his blood sugars 2times a day, fasting and Supper/bedtime  Call [facility] if: BG persistently < 70 or > 300. . I reviewed the Rule of 15 for the treatment of hypoglycemia in detail with the patient. Literature supplied.    2) Diabetic complications:   Eye: Does not have known diabetic retinopathy.   Neuro/ Feet: Does have known diabetic peripheral neuropathy .   Renal: Patient does not have known baseline CKD.       F/U in 4 months    Signed electronically by: Lyndle Herrlich, MD  [facility] Endocrinology  [facility] [address] [place] [place], [place] [address] Phone: [phone] FAX: [phone]   CC: Philip Aspen, Limmie Patricia, MD [address] [place] [address] Phone: [phone]  Fax: [phone]  Return to Endocrinology clinic as below: No future appointments.

## 2019-12-24 ENCOUNTER — Ambulatory Visit: Payer: Self-pay | Admitting: Internal Medicine

## 2020-02-18 ENCOUNTER — Encounter: Payer: Self-pay | Admitting: Internal Medicine

## 2020-02-18 ENCOUNTER — Other Ambulatory Visit: Payer: Self-pay

## 2020-02-18 MED ORDER — DULOXETINE HCL 30 MG PO CPEP: ORAL_CAPSULE | ORAL | 1 refills | Status: DC

## 2020-03-18 ENCOUNTER — Encounter: Payer: Self-pay | Admitting: Internal Medicine

## 2020-03-25 ENCOUNTER — Other Ambulatory Visit: Payer: Self-pay

## 2020-03-26 ENCOUNTER — Encounter: Payer: Self-pay | Admitting: Internal Medicine

## 2020-03-26 ENCOUNTER — Ambulatory Visit (INDEPENDENT_AMBULATORY_CARE_PROVIDER_SITE_OTHER): Payer: Self-pay | Admitting: Internal Medicine

## 2020-03-26 VITALS — BP 130/80 | HR 110 | Temp 97.9°F | Wt 173.6 lb

## 2020-03-26 DIAGNOSIS — E1140 Type 2 diabetes mellitus with diabetic neuropathy, unspecified: Secondary | ICD-10-CM

## 2020-03-26 DIAGNOSIS — M79605 Pain in left leg: Secondary | ICD-10-CM

## 2020-03-26 DIAGNOSIS — I10 Essential (primary) hypertension: Secondary | ICD-10-CM

## 2020-03-26 DIAGNOSIS — M79604 Pain in right leg: Secondary | ICD-10-CM

## 2020-03-26 DIAGNOSIS — Z794 Long term (current) use of insulin: Secondary | ICD-10-CM

## 2020-03-26 DIAGNOSIS — E1142 Type 2 diabetes mellitus with diabetic polyneuropathy: Secondary | ICD-10-CM

## 2020-03-26 LAB — POCT GLYCOSYLATED HEMOGLOBIN (HGB A1C): Hemoglobin A1C: 11 % — AB (ref 4.0–5.6)

## 2020-03-26 MED ORDER — GABAPENTIN 300 MG PO CAPS: 1200.0000 mg | ORAL_CAPSULE | Freq: Every day | ORAL | 2 refills | Status: AC

## 2020-03-26 NOTE — Patient Instructions (Signed)
-  Nice seeing you today!!  -Schedule follow up in 3 months for your physical. Please come in fasting that day.  -Will schedule a study to check the circulation of your legs.

## 2020-03-26 NOTE — Progress Notes (Signed)
Established Patient Office Visit     This visit occurred during the SARS-CoV-2 public health emergency.  Safety protocols were in place, including screening questions prior to the visit, additional usage of staff PPE, and extensive cleaning of exam room while observing appropriate contact time as indicated for disinfecting solutions.    CC/Reason for Visit: Discuss leg pain  HPI: Christopher Mercado is a 44 y.o. male who is coming in today for the above mentioned reasons. Past Medical History is significant for: Uncontrolled diabetes now being followed by endocrinology, hyperlipidemia not on medications, tobacco abuse with significant decrease in smoking over the last few months.  He comes in today with complaints of continued leg pain.  He has a diagnosis of diabetic neuropathy and is on gabapentin.  He describes burning sensation on the soles of his feet that goes up to his knees.  Lately he has noticed some purple toes, his feet also feel cold all the time.  He does not describe typical claudication symptoms.  He was started on Cymbalta by endocrine but he feels it does not make any difference and wants to know about stopping it.  Have offered flu vaccine, he declines, he is not vaccinated against Covid.   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    Diabetes mellitus without complication (HCC)    Dislocation of right acromioclavicular joint with greater than 200% displacement 03/08/2016   Elevated LFTs    Esophageal reflux    Gastritis    Gastroparesis    Seen by GI once - has "lived with it"   Hypertension    Meningitis, unspecified(322.9)    28-36 years of age   Polyuria     Past Surgical History:  Procedure Laterality Date   ACROMIO-CLAVICULAR JOINT REPAIR Right 03/08/2016   Procedure: RIGHT SHOULDER ACROMIOCLAVICULAR LIGAMENT REPAIR;  Surgeon: Teryl Lucy, MD;  Location: [facility];  Service: Orthopedics;  Laterality: Right;   HERNIA REPAIR     49-6 years of age   46 TOOTH EXTRACTION      Social History:  reports that he has been smoking cigarettes. He has been smoking about 0.50 packs per day. His smokeless tobacco use includes snuff. He reports current alcohol use. He reports that he does not use drugs.  Allergies: Allergies  Allergen Reactions   Coconut Fatty Acids Hives, Shortness Of Breath and Swelling   Prednisone Rash    Family History:  Family History  Problem Relation Age of Onset   Prostate cancer Father    Cancer Father    Heart attack Father    Heart disease Father    Hypertension Mother    CAD Mother    Heart disease Mother    Hyperlipidemia Maternal Grandfather    Hypertension Maternal Grandfather    Colon cancer Other        uncle   Heart disease Other    Liver disease Other      Current Outpatient Medications:    acetaminophen (TYLENOL) 500 MG tablet, Take 500-1,000 mg by mouth every 8 (eight) hours as needed for mild pain or headache. , Disp: , Rfl:    aspirin EC 81 MG tablet, Take 1 tablet (81 mg total) by mouth daily., Disp:  , Rfl:    diphenhydrAMINE (BENADRYL) 25 mg capsule, Take 25 mg by mouth every 8 (eight) hours as needed for itching or allergies., Disp: , Rfl:  gabapentin (NEURONTIN) 300 MG capsule, Take 4 capsules (1,200 mg total) by mouth at bedtime., Disp: 120 capsule, Rfl: 2   insulin NPH Human (HUMULIN N) 100 UNIT/ML injection, Inject 0.22 mLs (22 Units total) into the skin 2 (two) times daily before a meal., Disp: 10 mL, Rfl: 11   Insulin Syringe-Needle U-100 (RELION INSULIN SYR 0.3CC/30G) 30G X 5/16" 0.3 ML MISC, 1 Device by Does not apply route 2 (two) times daily with a meal. (Patient taking differently: 22 Units by Does not apply route 2 (two) times daily with a meal. ), Disp: 50 each, Rfl: 11   metFORMIN (GLUCOPHAGE) 1000 MG tablet, Take 1 tablet (1,000 mg total) by  mouth 2 (two) times daily with a meal., Disp: 180 tablet, Rfl: 1  Review of Systems:  Constitutional: Denies fever, chills, diaphoresis, appetite change and fatigue.  HEENT: Denies photophobia, eye pain, redness, hearing loss, ear pain, congestion, sore throat, rhinorrhea, sneezing, mouth sores, trouble swallowing, neck pain, neck stiffness and tinnitus.   Respiratory: Denies SOB, DOE, cough, chest tightness,  and wheezing.   Cardiovascular: Denies chest pain, palpitations and leg swelling.  Gastrointestinal: Denies nausea, vomiting, abdominal pain, diarrhea, constipation, blood in stool and abdominal distention.  Genitourinary: Denies dysuria, urgency, frequency, hematuria, flank pain and difficulty urinating.  Endocrine: Denies: hot or cold intolerance, sweats, changes in hair or nails, polyuria, polydipsia. Musculoskeletal: Denies myalgias, back pain. Skin: Denies pallor, rash and wound.  Neurological: Denies dizziness, seizures, syncope, weakness, light-headedness, numbness and headaches.  Hematological: Denies adenopathy. Easy bruising, personal or family bleeding history  Psychiatric/Behavioral: Denies suicidal ideation, mood changes, confusion, nervousness, sleep disturbance and agitation    Physical Exam: Vitals:   03/26/20 1107  BP: 130/80  Pulse: (!) 110  Temp: 97.9 F (36.6 C)  TempSrc: Oral  SpO2: 94%  Weight: 173 lb 9.6 oz (78.7 kg)    Body mass index is 28.02 kg/m.   Constitutional: NAD, calm, comfortable Eyes: PERRL, lids and conjunctivae normal ENMT: Mucous membranes are moist. Respiratory: clear to auscultation bilaterally, no wheezing, no crackles. Normal respiratory effort. No accessory muscle use.  Cardiovascular: Regular rate and rhythm, no murmurs / rubs / gallops. No extremity edema. 2+ pedal pulses.  Purplish discoloration to toes with delayed capillary refill.  Psychiatric: Normal judgment and insight. Alert and oriented x 3. Normal mood.     Impression and Plan:  Type 2 diabetes mellitus with diabetic polyneuropathy, with long-term current use of insulin (HCC) Pain in both lower extremities  -I think it is important to check ABIs to rule out circulatory issues. -Even though he is prescribed gabapentin 1200 mg at bedtime, he tells me he is only taking 1 tablet in the morning and 1 tablet at bedtime for a total of 600 mg.  He will increase gabapentin to 300+600. -He is not interested in continuing Cymbalta as he feels he has not made any difference, I will take off his medication list.  Essential hypertension -Blood pressure is well controlled, he is not on antihypertensives.  Type 2 diabetes mellitus with diabetic neuropathy, unspecified whether long term insulin use (HCC)  -A1c is elevated to 11 today, he is followed by endocrinology. -He continues to drink large amounts of sweet tea.    Patient Instructions  -Nice seeing you today!!  -Schedule follow up in 3 months for your physical. Please come in fasting that day.  -Will schedule a study to check the circulation of your legs.     Chaya Jan, MD [place]  Primary Care at [facility]

## 2020-03-30 ENCOUNTER — Telehealth: Payer: Self-pay | Admitting: Internal Medicine

## 2020-03-30 NOTE — Telephone Encounter (Signed)
Pt wife called to check on the referral and also lab work. Is he to fast or not and does he come to this office of [facility]?  She is trying to get everything done at one time  Please advise [phone]

## 2020-03-31 NOTE — Telephone Encounter (Signed)
CPE is to be in office in 3 months. He is to come in fasting that day. I had ordered ABIs. I am assuming this is the referral she wants follow up on.

## 2020-03-31 NOTE — Addendum Note (Signed)
Addended by: Lerry Liner on: 03/31/2020 01:15 PM   Modules accepted: Orders

## 2020-03-31 NOTE — Telephone Encounter (Signed)
Spoke with wife and nothing further is needed.

## 2020-04-03 ENCOUNTER — Ambulatory Visit (HOSPITAL_COMMUNITY)
Admission: RE | Admit: 2020-04-03 | Discharge: 2020-04-03 | Disposition: A | Discharge status: Home / Self Care | Payer: Self-pay | Source: Ambulatory Visit | Attending: Cardiovascular Disease | Admitting: Cardiovascular Disease

## 2020-04-03 ENCOUNTER — Other Ambulatory Visit: Payer: Self-pay

## 2020-04-03 ENCOUNTER — Other Ambulatory Visit (INDEPENDENT_AMBULATORY_CARE_PROVIDER_SITE_OTHER): Payer: Self-pay

## 2020-04-03 DIAGNOSIS — M79605 Pain in left leg: Secondary | ICD-10-CM | POA: Insufficient documentation

## 2020-04-03 DIAGNOSIS — E1142 Type 2 diabetes mellitus with diabetic polyneuropathy: Secondary | ICD-10-CM | POA: Insufficient documentation

## 2020-04-03 DIAGNOSIS — Z794 Long term (current) use of insulin: Secondary | ICD-10-CM | POA: Insufficient documentation

## 2020-04-03 DIAGNOSIS — M79604 Pain in right leg: Secondary | ICD-10-CM | POA: Insufficient documentation

## 2020-04-03 DIAGNOSIS — R200 Anesthesia of skin: Secondary | ICD-10-CM

## 2020-04-03 DIAGNOSIS — IMO0002 Reserved for concepts with insufficient information to code with codable children: Secondary | ICD-10-CM

## 2020-04-03 DIAGNOSIS — I10 Essential (primary) hypertension: Secondary | ICD-10-CM

## 2020-04-03 NOTE — Addendum Note (Signed)
Addended by: Lerry Liner on: 04/03/2020 08:10 AM   Modules accepted: Orders

## 2020-04-03 NOTE — Addendum Note (Signed)
Addended by: Lerry Liner on: 04/03/2020 07:50 AM   Modules accepted: Orders

## 2020-04-04 LAB — COMPREHENSIVE METABOLIC PANEL
AG Ratio: 1.6 (calc) (ref 1.0–2.5)
ALT: 39 U/L (ref 9–46)
AST: 22 U/L (ref 10–40)
Albumin: 4.5 g/dL (ref 3.6–5.1)
Alkaline phosphatase (APISO): 87 U/L (ref 36–130)
BUN: 11 mg/dL (ref 7–25)
CO2: 28 mmol/L (ref 20–32)
Calcium: 10 mg/dL (ref 8.6–10.3)
Chloride: 94 mmol/L — ABNORMAL LOW (ref 98–110)
Creat: 0.72 mg/dL (ref 0.60–1.35)
Globulin: 2.9 g/dL (calc) (ref 1.9–3.7)
Glucose, Bld: 330 mg/dL — ABNORMAL HIGH (ref 65–99)
Potassium: 4.6 mmol/L (ref 3.5–5.3)
Sodium: 132 mmol/L — ABNORMAL LOW (ref 135–146)
Total Bilirubin: 0.4 mg/dL (ref 0.2–1.2)
Total Protein: 7.4 g/dL (ref 6.1–8.1)

## 2020-04-04 LAB — CBC WITH DIFFERENTIAL/PLATELET
Absolute Monocytes: 442 cells/uL (ref 200–950)
Basophils Absolute: 40 cells/uL (ref 0–200)
Basophils Relative: 0.6 %
Eosinophils Absolute: 161 cells/uL (ref 15–500)
Eosinophils Relative: 2.4 %
HCT: 49.3 % (ref 38.5–50.0)
Hemoglobin: 16.8 g/dL (ref 13.2–17.1)
Lymphs Abs: 1722 cells/uL (ref 850–3900)
MCH: 30.5 pg (ref 27.0–33.0)
MCHC: 34.1 g/dL (ref 32.0–36.0)
MCV: 89.5 fL (ref 80.0–100.0)
MPV: 10.2 fL (ref 7.5–12.5)
Monocytes Relative: 6.6 %
Neutro Abs: 4335 cells/uL (ref 1500–7800)
Neutrophils Relative %: 64.7 %
Platelets: 277 10*3/uL (ref 140–400)
RBC: 5.51 10*6/uL (ref 4.20–5.80)
RDW: 12.1 % (ref 11.0–15.0)
Total Lymphocyte: 25.7 %
WBC: 6.7 10*3/uL (ref 3.8–10.8)

## 2020-04-04 LAB — LIPID PANEL
Cholesterol: 267 mg/dL — ABNORMAL HIGH (ref <200)
HDL: 70 mg/dL (ref >=40)
LDL Cholesterol (Calc): 149 mg/dL (calc) — ABNORMAL HIGH
Non-HDL Cholesterol (Calc): 197 mg/dL (calc) — ABNORMAL HIGH (ref <130)
Total CHOL/HDL Ratio: 3.8 (calc) (ref <5.0)
Triglycerides: 315 mg/dL — ABNORMAL HIGH (ref <150)

## 2020-04-04 LAB — MICROALBUMIN / CREATININE URINE RATIO
Creatinine, Urine: 95 mg/dL (ref 20–320)
Microalb Creat Ratio: 98 mcg/mg creat — ABNORMAL HIGH (ref <30)
Microalb, Ur: 9.3 mg/dL

## 2020-04-04 LAB — VITAMIN D 25 HYDROXY (VIT D DEFICIENCY, FRACTURES): Vit D, 25-Hydroxy: 25 ng/mL — ABNORMAL LOW (ref 30–100)

## 2020-04-04 LAB — TSH: TSH: 1.73 mIU/L (ref 0.40–4.50)

## 2020-04-04 LAB — VITAMIN B12: Vitamin B-12: 720 pg/mL (ref 200–1100)

## 2020-04-05 ENCOUNTER — Other Ambulatory Visit: Payer: Self-pay | Admitting: Internal Medicine

## 2020-04-05 DIAGNOSIS — IMO0002 Reserved for concepts with insufficient information to code with codable children: Secondary | ICD-10-CM

## 2020-04-05 MED ORDER — LISINOPRIL 5 MG PO TABS: 5.0000 mg | ORAL_TABLET | Freq: Every day | ORAL | 1 refills | Status: AC

## 2020-04-08 ENCOUNTER — Other Ambulatory Visit: Payer: Self-pay | Admitting: Internal Medicine

## 2020-04-08 DIAGNOSIS — IMO0002 Reserved for concepts with insufficient information to code with codable children: Secondary | ICD-10-CM

## 2020-04-21 ENCOUNTER — Encounter (HOSPITAL_COMMUNITY): Payer: Medicaid Other

## 2020-04-27 ENCOUNTER — Ambulatory Visit (INDEPENDENT_AMBULATORY_CARE_PROVIDER_SITE_OTHER): Payer: Medicaid Other | Admitting: Internal Medicine

## 2020-04-27 ENCOUNTER — Other Ambulatory Visit: Payer: Self-pay

## 2020-04-27 ENCOUNTER — Encounter: Payer: Self-pay | Admitting: Internal Medicine

## 2020-04-27 VITALS — BP 118/70 | HR 104 | Ht 66.0 in | Wt 178.5 lb

## 2020-04-27 DIAGNOSIS — E1165 Type 2 diabetes mellitus with hyperglycemia: Secondary | ICD-10-CM

## 2020-04-27 DIAGNOSIS — E1140 Type 2 diabetes mellitus with diabetic neuropathy, unspecified: Secondary | ICD-10-CM

## 2020-04-27 DIAGNOSIS — Z794 Long term (current) use of insulin: Secondary | ICD-10-CM

## 2020-04-27 LAB — GLUCOSE, POCT (MANUAL RESULT ENTRY): POC Glucose: 287 mg/dl — AB (ref 70–99)

## 2020-04-27 MED ORDER — INSULIN GLARGINE 100 UNIT/ML ~~LOC~~ SOLN: 30.0000 [IU] | Freq: Every day | SUBCUTANEOUS | 11 refills | Status: AC

## 2020-04-27 NOTE — Patient Instructions (Addendum)
-   Continue Metformin 1000 mg Twice daily with meal  - Continue ReliOn- N 22 units in the morning an 22 units at Bedtime for now    ONCE YOU ARE DONE WITH NPH BOTTLE PLEASE PICK UP LANTUS ( new insulin )   Lantus 30 units daily and continue Metformin 1 tablet twice a day        HOW TO TREAT LOW BLOOD SUGARS (Blood sugar LESS THAN 70 MG/DL)  Please follow the RULE OF 15 for the treatment of hypoglycemia treatment (when your (blood sugars are less than 70 mg/dL)    STEP 1: Take 15 grams of carbohydrates when your blood sugar is low, which includes:   3-4 GLUCOSE TABS  OR  3-4 OZ OF JUICE OR REGULAR SODA OR  ONE TUBE OF GLUCOSE GEL     STEP 2: RECHECK blood sugar in 15 MINUTES STEP 3: If your blood sugar is still low at the 15 minute recheck --> then, go back to STEP 1 and treat AGAIN with another 15 grams of carbohydrates.

## 2020-04-27 NOTE — Progress Notes (Signed)
Name: Siddhant Hashemi Solberg  Age/ Sex: 44 y.o., male   MRN/ DOB: [id number], 1976-01-23     PCP: Philip Aspen, Limmie Patricia, MD   Reason for Endocrinology Evaluation: Type 2 Diabetes Mellitus  Initial Endocrine Consultative Visit: 09/19/2019    PATIENT IDENTIFIER: Christopher Mercado is a 44 y.o. male with a past medical history of T2Dn. The patient has followed with Endocrinology clinic since 09/19/2019 for consultative assistance with management of his diabetes.  DIABETIC HISTORY:  Mr. Chipps was diagnosed with DM in 2017, he has been on metformin and tresiba since his diagnosis. His hemoglobin A1c has ranged from 8.9% in 2017, peaking at 11.8% in 2020.  On his initial visit to our clinic, his A1c was 12.1%  he was on Metformin and Evaristo Bury but was not taking the tresiba due to cost . We continued metformin  And switched Tresiba to Novolin-N  SUBJECTIVE:   During the last visit (12/23/2019): A1c 9.5% Continued metformin and NPH   Today (04/27/2020): Mr. Krupka is here for diabetes management. He is here without his wife today, she usually provided his history.  He checks his blood sugars occasionally. The patient has not had hypoglycemic episodes since the last clinic visit.    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    Diabetes mellitus without complication (HCC)    Dislocation of right acromioclavicular joint with greater than 200% displacement 03/08/2016   Elevated LFTs    Esophageal reflux     Gastritis    Gastroparesis    Seen by GI once - has "lived with it"   Hypertension    Meningitis, unspecified(322.9)    24-83 years of age   Polyuria    Past Surgical History:  Past Surgical History:  Procedure Laterality Date   ACROMIO-CLAVICULAR JOINT REPAIR Right 03/08/2016   Procedure: RIGHT SHOULDER ACROMIOCLAVICULAR LIGAMENT REPAIR;  Surgeon: Teryl Lucy, MD;  Location: [facility];  Service: Orthopedics;  Laterality: Right;   HERNIA REPAIR     56-28 years of age   71 TOOTH EXTRACTION      Social History:  reports that he has been smoking cigarettes. He has been smoking about 0.50 packs per day. His smokeless tobacco use includes snuff. He reports current alcohol use. He reports that he does not use drugs. Family History:  Family History  Problem Relation Age of Onset   Prostate cancer Father    Cancer Father    Heart attack Father    Heart disease Father    Hypertension Mother    CAD Mother    Heart disease Mother    Hyperlipidemia Maternal Grandfather    Hypertension Maternal Grandfather    Colon cancer Other        uncle   Heart disease Other    Liver disease Other      HOME MEDICATIONS: Allergies as of 04/27/2020      Reactions   Coconut Fatty Acids Hives, Shortness Of Breath,  Swelling   Prednisone Rash      Medication List       Accurate as of April 27, 2020 10:09 AM. If you have any questions, ask your nurse or doctor.        acetaminophen 500 MG tablet Commonly known as: TYLENOL Take 500-1,000 mg by mouth every 8 (eight) hours as needed for mild pain or headache.   aspirin EC 81 MG tablet Take 1 tablet (81 mg total) by mouth daily.   diphenhydrAMINE 25 mg capsule Commonly known as: BENADRYL Take 25 mg by mouth every 8 (eight) hours as needed for itching or allergies.   gabapentin 300 MG capsule Commonly known as: NEURONTIN Take 4 capsules (1,200 mg total) by mouth at bedtime.   insulin NPH Human  100 UNIT/ML injection Commonly known as: HumuLIN N Inject 0.22 mLs (22 Units total) into the skin 2 (two) times daily before a meal.   Insulin Syringe-Needle U-100 30G X 5/16" 0.3 ML Misc Commonly known as: RELION INSULIN SYR 0.3CC/30G 1 Device by Does not apply route 2 (two) times daily with a meal. What changed: how much to take   lisinopril 5 MG tablet Commonly known as: ZESTRIL Take 1 tablet (5 mg total) by mouth daily.   metFORMIN 1000 MG tablet Commonly known as: GLUCOPHAGE Take 1 tablet (1,000 mg total) by mouth 2 (two) times daily with a meal.        OBJECTIVE:   Vital Signs: BP 118/70    Pulse (!) 104    Ht 5\' 6"  (1.676 m)    Wt 178 lb 8 oz (81 kg)    SpO2 97%    BMI 28.81 kg/m   Wt Readings from Last 3 Encounters:  04/27/20 178 lb 8 oz (81 kg)  03/26/20 173 lb 9.6 oz (78.7 kg)  12/23/19 175 lb 9.6 oz (79.7 kg)     Exam: General: Pt appears well and is in NAD  Lungs: Clear with good BS bilat with no rales, rhonchi, or wheezes  Heart: RRR with normal S1 and S2 and no gallops; no murmurs; no rub  Abdomen: Normoactive bowel sounds, soft, nontender, without masses or organomegaly palpable  Extremities: No pretibial edema.   Neuro: MS is good with appropriate affect, pt is alert and Ox3    DM foot exam:  12/23/2019 The skin of the feet is without sores or ulcerations, plantar callous formation noted  The pedal pulses are 2+ on right and 2+ on left. The sensation is decreased to a screening 5.07, 10 gram monofilament bilaterally        DATA REVIEWED:  Lab Results  Component Value Date   HGBA1C 11.0 (A) 03/26/2020   HGBA1C 9.5 (A) 12/23/2019   HGBA1C 12.1 (A) 09/19/2019   Lab Results  Component Value Date   MICROALBUR 9.3 04/03/2020   LDLCALC 149 (H) 04/03/2020   CREATININE 0.72 04/03/2020     Lab Results  Component Value Date   CHOL 267 (H) 04/03/2020   HDL 70 04/03/2020   LDLCALC 149 (H) 04/03/2020   TRIG 315 (H) 04/03/2020   CHOLHDL 3.8  04/03/2020         ASSESSMENT / PLAN / RECOMMENDATIONS:   1) Type 2 Diabetes Mellitus, poorly controlled diabetes , With Neuropathic complications - Most recent A1c of 11.0 %. Goal A1c < 7.0 %.    -He is confused about his dose, he tells me he is on 14 units of insulin but on his last visit he  told me he increased it to 22 units and we kept it at that.  - I am going to switch NPH to lantus, he prefers vials    MEDICATIONS:  Continue Metformin 1000 mg BID   Stop NPH   Start Lantus 30 units daily   EDUCATION / INSTRUCTIONS:  BG monitoring instructions: Patient is instructed to check his blood sugars 1 times a day.  Call [place] [facility] if: BG persistently < 70   I reviewed the Rule of 15 for the treatment of hypoglycemia in detail with the patient. Literature supplied.    2) Diabetic complications:   Eye: Does not have known diabetic retinopathy.   Neuro/ Feet: Does have known diabetic peripheral neuropathy .   Renal: Patient does not have known baseline CKD. The pt is on ACEi.      3) Dyslipidemia:  - LDL elevated at 149 mg/dL. He is not on a statin . Will discuss on next visit    F/U in 4 months    Signed electronically by: Lyndle Herrlich, MD  [facility] Endocrinology  [facility] [address] [place] [place], [place] [address] Phone: [phone] FAX: [phone]   CC: Philip Aspen, Limmie Patricia, MD [address] [place] [address] Phone: [phone]  Fax: [phone]  Return to Endocrinology clinic as below: Future Appointments  Date Time Provider Department Center  05/12/2020  7:40 AM [facility]

## 2020-04-28 DIAGNOSIS — E1165 Type 2 diabetes mellitus with hyperglycemia: Secondary | ICD-10-CM | POA: Insufficient documentation

## 2020-04-29 ENCOUNTER — Telehealth: Payer: Self-pay | Admitting: Internal Medicine

## 2020-04-29 NOTE — Telephone Encounter (Signed)
Received fax from [facility] pharmacy stating vial and pen not covered.  Process prior authorization or therapy change. Please advise

## 2020-05-01 NOTE — Telephone Encounter (Signed)
Prior authorization have been sent

## 2020-05-01 NOTE — Telephone Encounter (Deleted)
Prior Berkley Harvey has been completed and faxed.

## 2020-05-11 NOTE — Telephone Encounter (Signed)
error 

## 2020-05-12 ENCOUNTER — Other Ambulatory Visit: Payer: Self-pay

## 2020-08-28 ENCOUNTER — Ambulatory Visit: Payer: Medicaid Other | Admitting: Internal Medicine

## 2020-08-28 NOTE — Progress Notes (Deleted)
Name: Christopher Mercado  Age/ Sex: 45 y.o., male   MRN/ DOB: [id number], 11-15-1975     PCP: Philip Aspen, Limmie Patricia, MD   Reason for Endocrinology Evaluation: Type 2 Diabetes Mellitus  Initial Endocrine Consultative Visit: 09/19/2019    PATIENT IDENTIFIER: Christopher Mercado is a 45 y.o. male with a past medical history of T2Dn. The patient has followed with Endocrinology clinic since 09/19/2019 for consultative assistance with management of his diabetes.  DIABETIC HISTORY:  Christopher Mercado was diagnosed with DM in 2017, he has been on metformin and tresiba since his diagnosis. His hemoglobin A1c has ranged from 8.9% in 2017, peaking at 11.8% in 2020.  On his initial visit to our clinic, his A1c was 12.1%  he was on Metformin and Evaristo Bury but was not taking the tresiba due to cost . We continued metformin  And switched Tresiba to Novolin-N  SUBJECTIVE:   During the last visit (04/27/2020): A1c 11.0% Continued metformin and switched NPH to lantus      Today (08/28/2020): Christopher Mercado is here for diabetes management. He is here without his wife today, she usually provided his history.  He checks his blood sugars occasionally. The patient has not had hypoglycemic episodes since the last clinic visit.    HOME DIABETES REGIMEN:  Metformin 1000 mg BID  Lantus 30 units daily      Statin: no ACE-I/ARB: no    METER DOWNLOAD SUMMARY: Did not bring a meter     DIABETIC COMPLICATIONS: Microvascular complications:   Neuropathy  Denies: CKD, retinopathy   Last eye exam: Completed 11/2019    Macrovascular complications:    Denies: CAD, PVD, CVA   HISTORY:  Past Medical History:  Past Medical History:  Diagnosis Date  . 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 Diabetes mellitus without complication (HCC)   . 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 Elevated LFTs   . Esophageal reflux   .  Gastritis   . Gastroparesis    Seen by GI once - has "lived with it"  . Hypertension   . Meningitis, unspecified(322.9)    21-81 years of age  . Polyuria    Past Surgical History:  Past Surgical History:  Procedure Laterality Date  . ACROMIO-CLAVICULAR JOINT REPAIR Right 03/08/2016   Procedure: RIGHT SHOULDER ACROMIOCLAVICULAR LIGAMENT REPAIR;  Surgeon: Teryl Lucy, MD;  Location: [facility];  Service: Orthopedics;  Laterality: Right;  . HERNIA REPAIR     4-72 years of age  . WISDOM TOOTH EXTRACTION      Social History:  reports that he has been smoking cigarettes. He has been smoking about 0.50 packs per day. His smokeless tobacco use includes snuff. He reports current alcohol use. He reports that he does not use drugs. Family History:  Family History  Problem Relation Age of Onset  . Prostate cancer Father   . Cancer Father   . Heart attack Father   . Heart disease Father   . Hypertension Mother   . CAD Mother   . Heart disease Mother   . Hyperlipidemia Maternal Grandfather   . Hypertension Maternal Grandfather   . Colon cancer Other        uncle  . Heart disease Other   . Liver disease Other      HOME MEDICATIONS: Allergies as of 08/28/2020      Reactions   Coconut Fatty Acids Hives, Shortness Of Breath, Swelling  Prednisone Rash      Medication List       Accurate as of August 28, 2020  7:26 AM. If you have any questions, ask your nurse or doctor.        acetaminophen 500 MG tablet Commonly known as: TYLENOL Take 500-1,000 mg by mouth every 8 (eight) hours as needed for mild pain or headache.   aspirin EC 81 MG tablet Take 1 tablet (81 mg total) by mouth daily.   diphenhydrAMINE 25 mg capsule Commonly known as: BENADRYL Take 25 mg by mouth every 8 (eight) hours as needed for itching or allergies.   gabapentin 300 MG capsule Commonly known as: NEURONTIN Take 4 capsules (1,200 mg total) by mouth at bedtime.   insulin glargine 100  UNIT/ML injection Commonly known as: Lantus Inject 0.3 mLs (30 Units total) into the skin daily.   Insulin Syringe-Needle U-100 30G X 5/16" 0.3 ML Misc Commonly known as: RELION INSULIN SYR 0.3CC/30G 1 Device by Does not apply route 2 (two) times daily with a meal. What changed: how much to take   lisinopril 5 MG tablet Commonly known as: ZESTRIL Take 1 tablet (5 mg total) by mouth daily.   metFORMIN 1000 MG tablet Commonly known as: GLUCOPHAGE Take 1 tablet (1,000 mg total) by mouth 2 (two) times daily with a meal.        OBJECTIVE:   Vital Signs: There were no vitals taken for this visit.  Wt Readings from Last 3 Encounters:  04/27/20 178 lb 8 oz (81 kg)  03/26/20 173 lb 9.6 oz (78.7 kg)  12/23/19 175 lb 9.6 oz (79.7 kg)     Exam: General: Pt appears well and is in NAD  Lungs: Clear with good BS bilat with no rales, rhonchi, or wheezes  Heart: RRR with normal S1 and S2 and no gallops; no murmurs; no rub  Abdomen: Normoactive bowel sounds, soft, nontender, without masses or organomegaly palpable  Extremities: No pretibial edema.   Neuro: MS is good with appropriate affect, pt is alert and Ox3    DM foot exam:  12/23/2019 The skin of the feet is without sores or ulcerations, plantar callous formation noted  The pedal pulses are 2+ on right and 2+ on left. The sensation is decreased to a screening 5.07, 10 gram monofilament bilaterally        DATA REVIEWED:  Lab Results  Component Value Date   HGBA1C 11.0 (A) 03/26/2020   HGBA1C 9.5 (A) 12/23/2019   HGBA1C 12.1 (A) 09/19/2019   Lab Results  Component Value Date   MICROALBUR 9.3 04/03/2020   LDLCALC 149 (H) 04/03/2020   CREATININE 0.72 04/03/2020     Lab Results  Component Value Date   CHOL 267 (H) 04/03/2020   HDL 70 04/03/2020   LDLCALC 149 (H) 04/03/2020   TRIG 315 (H) 04/03/2020   CHOLHDL 3.8 04/03/2020         ASSESSMENT / PLAN / RECOMMENDATIONS:   1) Type 2 Diabetes Mellitus, poorly  controlled diabetes , With Neuropathic complications - Most recent A1c of 11.0 %. Goal A1c < 7.0 %.    -He is confused about his dose, he tells me he is on 14 units of insulin but on his last visit he told me he increased it to 22 units and we kept it at that.  - I am going to switch NPH to lantus, he prefers vials    MEDICATIONS:  Continue Metformin 1000 mg BID   Start  Lantus 30 units daily   EDUCATION / INSTRUCTIONS:  BG monitoring instructions: Patient is instructed to check his blood sugars 1 times a day.  Call  Endocrinology clinic if: BG persistently < 70  . I reviewed the Rule of 15 for the treatment of hypoglycemia in detail with the patient. Literature supplied.    2) Diabetic complications:   Eye: Does not have known diabetic retinopathy.   Neuro/ Feet: Does have known diabetic peripheral neuropathy .   Renal: Patient does not have known baseline CKD. The pt is on ACEi.      3) Dyslipidemia:  - LDL elevated at 149 mg/dL. He is not on a statin . Will discuss on next visit    F/U in 4 months    Signed electronically by: Lyndle Herrlich, MD  [facility] Endocrinology  [facility] [address] [place] [place], [place] [address] Phone: [phone] FAX: [phone]   CC: Philip Aspen, Limmie Patricia, MD [address] [place] [address] Phone: [phone]  Fax: [phone]  Return to Endocrinology clinic as below: Future Appointments  Date Time Provider Department Center  08/28/2020  7:50 AM Shamleffer, Konrad Dolores, MD [id number] None

## 2022-08-18 NOTE — Progress Notes (Signed)
 SUBJECTIVE: Christopher Mercado is a 47 year old male who presents for follow up of diabetes.  He reports the following: home blood sugars are not performed, neurological symptoms b/l leg numbness and pain. Was seen in [place] in 2021. Since then  has been off insulin  and other meds.  A1c 11.6 in clinic. RBG- 377. Patient aware of issues related to uncontrolled DM-2.   Interval History: medication adherence: nonadherent much of the time Medical and social history: Reviewed and updated   ROS:   General: negative, chills, fever, headaches, and night sweats  Eye: negative, eye discharge, eye itching, eye pain, and vision loss Ear/Nose/Throat: negative, allergies, bleeding gums, and epistaxis-new Respiratory: negative, cough, hemoptysis, sputum production, and wheezing Cardiovascular: negative, cyanosis, lower extremity edema, paroxysmal nocturnal dyspnea, varicose veins, and syncopal episodes Gastrointestinal: negative, abdominal pain, constipation, diarrhea, dysphagia, jaundice, melena , nausea, and poor appetite Genitourinary: negative, dysuria, frequency, and hematuria Gyn: no abnormal bleeding, pelvic pain or discharge, no breast pain or new/enlarging lumps on self exam Skin: negative, bites, bruising, lesions, lumps or bumps, pigmentation, and rash Hem/Lymph: negative, bleeding disorder, bruising, swollen nodes, and weight loss Musculoskeletal: negative, joint pain, joint redness, and joint swelling Neuro: positive tingling and numbness in LE.  Psychiatric: negative, anxiety, depressed mood, difficulty falling asleep, and insomnia Endocrine: positive polyphagia, polydipsia, and polyuria    OBJECTIVE: BP 122/74 (Left Arm, Sitting, Large Adult)  Pulse 76  Temp 97.1 F (36.2 C)  Resp 16  Ht 5' 7 (1.702 m)  Wt 190 lb (86.2 kg)  SpO2 98%  BMI 29.76 kg/m  BSA 2.02 m  General Appearance: alert, no apparent distress and cooperative CVS/Pulm: S1, S2 normal, no murmur, click, rub or gallop,  regular rate and rhythm, carotids without bruits, chest is clear to auscultation bilaterally, no pedal edema feet: normal pedal  pulses, no trophic changes or ulcerative lesions, abnormal monofilament exam b/l2, blanching with elevation noted, capillary refill: 2 seconds, and dependent rubor present oral: buccal mucosa normal  ASSESSMENT & PLAN:  Assessment & Plan     1. Type 2 diabetes mellitus with hyperglycemia, with long-term current use of insulin  (HCC-CMS) (Primary) 2. Diabetic polyneuropathy associated with type 2 diabetes mellitus (HCC-CMS) 3. Nonadherence to medication 4. Dyslipidemia   Orders Placed This Encounter  Medications  . aspirin  81 mg DR tablet    Sig: Take 1 Tablet by mouth once daily    Dispense:  90 Tablet    Refill:  3        . metFORMIN  (GLUCOPHAGE ) 500 mg tablet    Sig: Take 1 Tablet by mouth 2 (two) times daily with a meal    Dispense:  180 Tablet    Refill:  3        . insulin  asp prt-insulin  aspart (NOVOLOG  MIX 70-30FLEXPEN U-100) 100 unit/mL (70-30)    Sig: Inject 15 Units into the skin 2 (two) times daily before a meal for 90 days    Dispense:  9 mL    Refill:  2        . gabapentin  (NEURONTIN ) 300 mg capsule    Sig: Take 1 Capsule by mouth nightly at bedtime for 90 days    Dispense:  30 Capsule    Refill:  2            Discussed with the patient that we have to bring his blood sugars down gradually.  Start metformin  500 twice daily and insulin  70/30 mix 15 units twice a day. Referral  to the endocrinologist who has previously seen the patient. Gabapentin  for diabetic neuropathy Check lipid panel. 1 baby aspirin  a day Follow-up in 3 months  Topics discussed: diabetic diet , use and side effects of insulin , foot care , glycohemoglobin and other lab monitoring, and long term diabetic complications .  Patient understands and agrees with the plan of care.

## 2023-01-30 NOTE — Progress Notes (Signed)
 Subjective     Chief Complaint:  Chief Complaint  Patient presents with  . Complete Physical Exam    Pt is in today for a physical and labs. He is due for a pneumonia vaccine, which he will be receiving today. He expresses no other concerns.     HPI:  Christopher Mercado is a 47 year old male. No new issues today. Has appointment set up with eye doctor and nutritionist in the upcoming month.  Working on smoking cessation. Does not want medication.  No alcohol or other drug use.  Sleep and appetite okay.  Wants to receive Pneumococcal vaccine.  No reported [facility] of cancer at a early age.   Patient Active Problem List  Diagnosis  . Tobacco use  . Substance abuse (HCC-CMS)  . RLS (restless legs syndrome)  . Neuropathy  . Nondiabetic gastroparesis  . HTN (hypertension)  . Diabetes (HCC-CMS)  . Anxiety  . GERD (gastroesophageal reflux disease)  . 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  PHQ9-   01/30/2023  1:39 PM  Little interest or pleasure in doing things Not at all  Feeling down, depressed or hopeless [include irritable if under 18] Not at all     Immunization History  Administered Date(s) Administered  . PPD 07/24/2018  . TDAP 12/24/2015  Pended Date(s) Pended  . PNEUMOCOCCAL CONJUGATE PCV 20 01/30/2023       Health Maintenance Due  Topic Date Due  . Diabetes Microalbumin (w/Creatinine)  Never done  . Hepatitis C Screening  Never done  . Tobacco Cessation Counseling (1) Never done  . Imm-Pneumococcal (1 of 2 - PCV) Never done  . Retinopathy Screening  Never done  . HIV Screening  Never done  . Imm-Hepatitis B (1 of 3 - 19+ 3-dose series) Never done  . Colorectal Cancer Screening  Never done     Review of Systems -    General: negative, chills, fever, headaches, and night sweats  Eye: negative, eye discharge, eye itching, eye pain, and vision loss Ear/Nose/Throat:  negative, allergies, bleeding gums, and epistaxis Respiratory: negative, cough, hemoptysis, sputum production, and wheezing Cardiovascular: negative, cyanosis, lower extremity edema, paroxysmal nocturnal dyspnea, varicose veins, and syncopal episodes Gastrointestinal: negative, abdominal pain, constipation, diarrhea, dysphagia, jaundice, melena , nausea, and poor appetite Genitourinary: negative, dysuria, frequency, and hematuria Skin: negative, bites, bruising, lesions, lumps or bumps, pigmentation, and rash Hem/Lymph: negative, bleeding disorder, bruising, swollen nodes, and weight loss Musculoskeletal: negative, joint pain, joint redness, and joint swelling Neuro: negative, involuntary movements, seizures, syncope, tremor, weakness, and presyncope Psychiatric: negative, anxiety, depressed mood, difficulty falling asleep, and insomnia Endocrine: negative, goiter, polyphagia, polydipsia, and polyuria    Failed to redirect to the Timeline version of the REVFS SmartLink.  History: Past Medical History:  Diagnosis Date  . DM (diabetes mellitus) (HCC-CMS)   . High cholesterol    Past Surgical History:  Procedure Laterality Date  . HERNIA REPAIR    . KNEE SURGERY    . SHOULDER SURGERY    . SHOULDER SURGERY      No family history on file. Social History   Tobacco Use  . Smoking status: Every Day    Current packs/day: 0.50    Types: Cigarettes  . Smokeless tobacco: Never  Vaping Use  . Vaping status: Never Used  Substance Use Topics  . Alcohol use: Not Currently  . Drug use: Never  Allergies  Allergen Reactions  . Coconut Hives, SOB and Swelling  . Prednisone  Rash  . Pollen Extracts Cough     Objective: Vitals:    Vitals:   01/30/23 1352  BP: 112/66  Pulse: 82  Temp: 98.4 F (36.9 C)  SpO2: 95%  Weight: 196 lb 12.8 oz (89.3 kg)  Height: 5' 7 (1.702 m)    Body mass index is 30.82 kg/m.    Physical Exam:  General: Well developed, well nourished, unlabored,  no apparent distress Skin: no rashes or lesions, no edema HEENT: Normocephalic, atraumatic, No oral lesion, No cervical adenopathy Thyroid moves with swallows, no bruits, nontender to palpation, no asymmetry ,oropharynx clear Chest:  Nontender to palpation Lungs:  Clear to ausculation, bilaterally and normal respiratory effort Heart: Regular rate and rhythm, S1, S2 normal, no murmurs Abdomen: Soft, non-tender non-distented; bowel sounds normal, no organomegaly Pulses: 2+ and symmetric  MSK: FROM  Neuro: CN II-XII grossly intact, Normal strength- 5/5 all extremities Psyc: Normal content and context of speech  Labs as follows:  Lab Results  Component Value Date   HGBA1C 8.1 (A) 11/24/2022   HGBA1C 11.6 (A) 08/18/2022    Results for orders placed or performed in visit on 11/24/22  A1C, ALERE AFINION (POCT)  Result Value Ref Range   HGB A1C 8.1 (A) 4.8 - 5.6 %    Medications:   Current Outpatient Medications on File Prior to Visit  Medication Sig Dispense Refill  . gabapentin  (NEURONTIN ) 300 mg capsule Take 2 Capsules by mouth 2 (two) times daily for 90 days 120 Capsule 2  . aspirin  81 mg DR tablet Take 1 Tablet by mouth once daily 90 Tablet 3  . atorvastatin (LIPITOR) 40 mg tablet Take 1 Tablet by mouth nightly at bedtime for 90 days 30 Tablet 2  . diphenhydrAMINE (BENADRYL) 25 mg capsule Take 25 mg by mouth every 4 (four) hours as needed for allergies    . insulin  asp prt-insulin  aspart (NOVOLOG  MIX 70-30FLEXPEN U-100) 100 unit/mL (70-30) injection Inject 15 Units into the skin 2 (two) times daily before a meal for 90 days 9 mL 2  . metFORMIN  (GLUCOPHAGE ) 500 mg tablet Take 1 Tablet by mouth 2 (two) times daily with a meal 180 Tablet 3  . pen needle, diabetic (BD NANO 2ND GEN PEN NEEDLE) 32 gauge x 5/32 ndle Use daily with insulin  as directed 100 Each 1   No current facility-administered medications on file prior to visit.       Assessment & Plan  1. Encounter for general  adult medical examination without abnormal findings  2. Screening for colorectal cancer - FECAL GLOBIN BY IMMUNOCHEMISTRY (FIT)  3. Diabetes (HCC-CMS) - MICROALBUMIN/CREATININE RATIO, URINE, RANDOM  4. Encounter for screening for HIV - HIV 1/0/2 AG/AB W/CASCADE RFLX SUPPLEMENTAL TESTING  5. Need for hepatitis C screening test - HEPATITIS C ANTIBODY WITH REFLEX TO HCV, RNA, QUANTITATIVE, REAL-TIME PCR (REFL)  6. Encounter for tobacco use cessation counseling  7. Encounter for vaccination - PCV20 VACCINE FOR INTRAMUSCULAR USE    Tobacco Intervention:provided smoking cessation counseling Counseling time: 3-10 minutes  I have reviewed the following records today: Details:  [] Labs [x] Internal []  External  [] Micro [] Internal []  External  [] Pathology [] Internal []  External  [] Imaging []  Internal[] External  [] Cardiology Procedures []  Internal[] External  [] Provider Records [] Internal [] External  [] Other [] Internal []  External       Follow Up: Return in about 3 months (around 05/02/2023) for DM-2.    Note - This record  has been created using [employer].  Chart creation errors have been sought, but may not always  have been located. Such creation errors do not reflect on  the standard of medical care.

## 2023-05-11 NOTE — Progress Notes (Signed)
 Subjective    No question data found.   Chief Complaint:  Chief Complaint  Patient presents with  . Diabetes Mellitus    Follow up      HPI:  Christopher Mercado is a 47 year old male. NO issues today. Feels well. Leg pain has subsided. He has erectile dysfunction for > 1 year now. D/w him this is likely due to uncontrolled DM-2/  A1c 9.6. Worsened since his last visit( 8.1).  He may benefit from dietary counseling if he desires.   Patient Active Problem List  Diagnosis  . Tobacco use  . Substance abuse (HCC-CMS)  . RLS (restless legs syndrome)  . Neuropathy  . Nondiabetic gastroparesis  . HTN (hypertension)  . Diabetes (HCC-CMS)  . Anxiety  . GERD (gastroesophageal reflux disease)  . 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 Microalbuminuria  . Dyslipidemia  . Diabetic polyneuropathy associated with type 2 diabetes mellitus (HCC-CMS)    Health Maintenance reviewed with the patient.  PHQ9-   01/30/2023  1:39 PM  Little interest or pleasure in doing things Not at all  Feeling down, depressed or hopeless [include irritable if under 18] Not at all     Immunization History  Administered Date(s) Administered  . PNEUMOCOCCAL CONJUGATE PCV 20 01/30/2023  . PPD 07/24/2018  . TDAP 12/24/2015       Health Maintenance Due  Topic Date Due  . Tobacco Cessation Counseling (1) Never done  . Retinopathy Screening  Never done  . Imm-Hepatitis B (1 of 3 - 19+ 3-dose series) Never done  . Colorectal Cancer Screening  Never done  . Imm-Influenza (1) Never done  . Imm-COVID-19 (1 - 2024-25 season) Never done     Review of Systems -     12 point ROS performed. Positive findings are as above. All other negative.   Failed to redirect to the Timeline version of the REVFS SmartLink.  History: Past Medical History:  Diagnosis Date  . DM (diabetes mellitus) (HCC-CMS)   . High cholesterol    Past Surgical History:  Procedure  Laterality Date  . DENTAL SURGERY    . HERNIA REPAIR    . KNEE SURGERY    . SHOULDER SURGERY    . SHOULDER SURGERY      Family History  Problem Relation Name Age of Onset  . Hypertension Mother    . Diabetes Mother    . High Cholesterol Mother     Social History   Tobacco Use  . Smoking status: Former    Current packs/day: 0.50    Types: Cigarettes  . Smokeless tobacco: Never  Vaping Use  . Vaping status: Never Used  Substance Use Topics  . Alcohol use: Not Currently  . Drug use: Never    Allergies  Allergen Reactions  . Coconut Hives, SOB and Swelling  . Coconut Fatty Acid Diethanolamide Hives, SOB and Swelling  . Prednisone  Rash  . Pollen Extracts Cough     Objective: Vitals:    Vitals:   05/11/23 0951  BP: 122/78  Pulse: 87  Temp: 97.1 F (36.2 C)  SpO2: 97%  Weight: 195 lb 3.2 oz (88.5 kg)  Height: 5' 7 (1.702 m)    Body mass index is 30.57 kg/m.    Physical Exam:  General: Well developed, well nourished, unlabored, no apparent distress Skin: no rashes or lesions, no edema HEENT: Normocephalic, atraumatic, No oral lesion oropharynx clear Chest:  Nontender to palpation Lungs:  Clear to ausculation, bilaterally and normal respiratory effort Heart: Regular rate and rhythm, S1, S2 normal, no murmurs Abdomen: Soft, non-tender non-distented; bowel sounds normal, no organomegaly Pulses: 2+ and symmetric   Neuro: CN II-XII grossly intact, Normal strength- 5/5 all extremities Psyc: Normal content and context of speech  Labs as follows:  Lab Results  Component Value Date   HGBA1C 9.6 (A) 05/11/2023   HGBA1C 8.1 (A) 11/24/2022    Results for orders placed or performed in visit on 05/11/23  A1C, ALERE AFINION (POCT)  Result Value Ref Range   HGB A1C 9.6 (A) 4.8 - 5.6 %    Medications:   Current Outpatient Medications on File Prior to Visit  Medication Sig Dispense Refill  . pen needle, diabetic (DROPLET PEN NEEDLE) 32 gauge x 5/32 ndle USE  DAILY WITH INSULIN  AS DIRECTED 100 Each 11  . diphenhydrAMINE (BENADRYL) 25 mg capsule Take 25 mg by mouth every 4 (four) hours as needed for allergies     No current facility-administered medications on file prior to visit.       Assessment & Plan  1. Type 2 diabetes mellitus with hyperglycemia, with long-term current use of insulin  (HCC-CMS) - A1C, ALERE AFINION (POCT) - lisinopriL  2.5 mg tablet; Take 1 Tablet by mouth once daily for 180 days  Dispense: 90 Tablet; Refill: 1 - aspirin  81 mg DR tablet; Take 1 Tablet by mouth once daily for 360 days  Dispense: 90 Tablet; Refill: 3 - metFORMIN  (GLUCOPHAGE ) 500 mg tablet; Take 1 Tablet by mouth 2 (two) times daily with a meal  Dispense: 180 Tablet; Refill: 3 - insulin  asp prt-insulin  aspart (NOVOLOG  MIX 70-30FLEXPEN U-100) 100 unit/mL (70-30) injection; Inject 20 Units into the skin 2 (two) times daily before a meal for 180 days  Dispense: 12 mL; Refill: 5  2. Dyslipidemia - atorvastatin (LIPITOR) 40 mg tablet; Take 1 Tablet by mouth nightly at bedtime for 180 days  Dispense: 90 Tablet; Refill: 1  3. Diabetic polyneuropathy associated with type 2 diabetes mellitus (HCC-CMS)  4. Microalbuminuria - lisinopriL  2.5 mg tablet; Take 1 Tablet by mouth once daily for 180 days  Dispense: 90 Tablet; Refill: 1  5. Combined arterial insufficiency and corporo-venous occlusive erectile dysfunction - tadalafiL (CIALIS) 5 mg tablet; Take 1 Tablet by mouth once daily as needed for erectile dysfunction for up to 90 days  Dispense: 30 Tablet; Refill: 4   Increased insulin  to 20 bid.   Did not want flu/covid vaccine.   I have reviewed the following records today: Details:  [] Labs [x] Internal []  External  [] Micro [] Internal []  External  [] Pathology [] Internal []  External  [] Imaging []  Internal[] External  [] Cardiology Procedures []  Internal[] External  [] Provider Records [x] Internal [] External  [] Other [] Internal []  External       Follow Up: Return  in about 3 months (around 08/11/2023) for DM-2.    Note - This record has been created using [employer].  Chart creation errors have been sought, but may not always  have been located. Such creation errors do not reflect on  the standard of medical care.

## 2023-10-04 NOTE — Progress Notes (Signed)
 Subjective   Christopher Mercado is a 48 year old male who presents today for  Chief Complaint  Patient presents with  . Diabetes Mellitus    Follow up    Pt was last seen 05/11/2023 for T2DM f/u. A1c at time had increased to 9.6. Insulin  was increased to 20u BID. He was told to f/u in 3mos  Pt is not sure how things have been going with his diabetes.  He has tried cutting breads.  Occasional rice and tortillas. He only drinks water, maybe tea at night.  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  He is still taking metformin  - used to have stomach upset when starting but has been doing well since.  He reports he was previously prescribed higher dose metformin  and had suicidal thoughts with this  Patient also states that Cialis has not been beneficial for erectile dysfunction  Gabapentin  has improved neuropathy however it does cause drowsiness if taken into high of a dose.  He has found that he can take 300 mg in the morning and 600 mg at night and this will be a fair balance between drowsiness and improvement to symptoms.  He does note swelling in feet at the end of a long workday.  He wears compression socks to try and help this    Patient Active Problem List   Diagnosis Date Noted  . Microalbuminuria 05/11/2023  . Dyslipidemia 05/11/2023  . Combined arterial insufficiency and corporo-venous occlusive erectile dysfunction 05/11/2023  . Diabetic polyneuropathy associated with type 2 diabetes mellitus (HCC-CMS) 08/18/2022  . Substance abuse (HCC-CMS) 01/02/2019  . Tobacco use 12/30/2018  . Diabetes (HCC-CMS) 12/30/2018  . HTN (hypertension) 08/29/2014    Last Assessment & Plan:   Formatting of this note might be different from the original.  Hypertension is well-controlled and below goal 140/90 with current regimen. No adverse side effects or symptoms of end organ damage. Encouraged to monitor blood pressure at home as able. Continue current dosage of amlodipine . Follow-up in 6  months or sooner if needed.   SABRA RLS (restless legs syndrome) 09/09/2011  . Anxiety 09/09/2011    Last Assessment & Plan:   Formatting of this note might be different from the original.  Anxiety appears well controlled with current dosage of alprazolam  with no adverse side effects. Takes medication as needed and fairly infrequently. Continue current dosage of alprazolam . Follow-up in 6 months or sooner if needed.   . Nondiabetic gastroparesis 03/01/2011  . GERD (gastroesophageal reflux disease) 03/01/2011    Past Medical History:  Diagnosis Date  . 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 DM (diabetes mellitus) (HCC-CMS)   . High cholesterol     Past Surgical History:  Procedure Laterality Date  . DENTAL SURGERY    . HERNIA REPAIR    . KNEE SURGERY    . SHOULDER SURGERY    . SHOULDER SURGERY      Tobacco History   Tobacco Use  Smoking Status Every Day  . Current packs/day: 0.50  . Types: Cigarettes  Smokeless Tobacco Never   Social History   Substance and Sexual Activity  Alcohol Use Yes   Comment: ocasionally   Social History   Substance and Sexual Activity  Drug Use Never   Social History   Substance and Sexual Activity  Sexual Activity Yes  . Partners: Female     08/18/2022  3:40 PM  How hard is it for you to pay for  the very basics like food, housing, heating, medical care, and medications? Not hard at all  How hard is it for you to pay for the very basics like food, housing, heating, medical care, and medications? Not hard at all  What is your living situation today?  I have a steady place to live  Think about the place you live. Do you have problems with any of the following?  None of the above  Number of positive responses to housing questions 0  Within the past 12 months, you worried that your food would run out before you got money to buy more. Never true  Within the past 12 months, the food you  bought just didn't last and you didn't have money to get more. Never true  Number of positive responses to food security questions 0  In the past 12 months, has lack of transportation kept you from medical appointments, meetings, work or from getting things needed for daily living? No  In the past 12 months has the electric, gas, oil, or water company threatened to shut off services in your home? No  Would you like assistance with any of the above items? No    Family History  Problem Relation Name Age of Onset  . Hypertension Mother    . Diabetes Mother    . High Cholesterol Mother       Review of Systems  Respiratory:  Negative for shortness of breath.   Cardiovascular:  Negative for chest pain, palpitations and leg swelling.  Gastrointestinal:  Negative for abdominal pain, diarrhea and nausea.  Genitourinary:  Positive for erectile dysfunction.  Neurological:  Positive for numbness. Negative for dizziness and headaches.    Objective   Vitals:   10/05/23 1520  BP: 135/79  Pulse: 99  Resp: 16  Temp: 98 F (36.7 C)  TempSrc: Temporal  SpO2: 96%  Weight: 199 lb 9.6 oz (90.5 kg)  Height: 5' 7 (1.702 m)   Estimated body mass index is 31.26 kg/m as calculated from the following:   Height as of this encounter: 5' 7 (1.702 m).   Weight as of this encounter: 199 lb 9.6 oz (90.5 kg). Facility age limit for growth %iles is 20 years.   Physical Exam Vitals reviewed.   Constitutional:      General: He is not in acute distress. HENT:     Head: Normocephalic and atraumatic.   Cardiovascular:     Rate and Rhythm: Normal rate and regular rhythm.     Pulses: Normal pulses.  Pulmonary:     Effort: Pulmonary effort is normal.     Breath sounds: Normal breath sounds.  Musculoskeletal:     Right lower leg: No edema.     Left lower leg: No edema.  Skin:    General: Skin is warm and dry.  Neurological:     General: No focal deficit present.     Mental Status: He is alert.   Psychiatric:        Mood and Affect: Mood normal.    Recent Results (from the past 24 hours)  A1C, ALERE AFINION (POCT)   Collection Time: 10/05/23  3:10 PM  Result Value Ref Range   HGB A1C 8.2 (A) 4.8 - 5.6 %    Assessment and Plan  1. Type 2 diabetes mellitus with diabetic polyneuropathy, with long-term current use of insulin  (HCC-CMS) - A1C, ALERE AFINION (POCT) - empagliflozin (JARDIANCE) 10 mg tab; Take 1 Tablet by mouth once daily  Dispense:  90 Tablet; Refill: 0 - BASIC METABOLIC PANEL CALCIUM TOTAL - MICROALBUMIN/CREATININE RATIO, URINE, RANDOM - lisinopriL  2.5 mg tablet; Take 1 Tablet by mouth once daily for 180 days  Dispense: 90 Tablet; Refill: 1 - insulin  asp prt-insulin  aspart (NOVOLOG  MIX 70-30FLEXPEN U-100) 100 unit/mL (70-30) injection; Inject 20 Units into the skin 2 (two) times daily before a meal  Dispense: 30 mL; Refill: 1 - gabapentin  (NEURONTIN ) 300 mg capsule; Take 1 Capsule by mouth every morning AND 2 Capsules every evening.  Dispense: 270 Capsule; Refill: 1  2. Microalbuminuria - MICROALBUMIN/CREATININE RATIO, URINE, RANDOM - lisinopriL  2.5 mg tablet; Take 1 Tablet by mouth once daily for 180 days  Dispense: 90 Tablet; Refill: 1  3. Dyslipidemia - empagliflozin (JARDIANCE) 10 mg tab; Take 1 Tablet by mouth once daily  Dispense: 90 Tablet; Refill: 0 - LIPID PANEL - atorvastatin (LIPITOR) 40 mg tablet; Take 1 Tablet by mouth nightly at bedtime  Dispense: 90 Tablet; Refill: 1  4. Primary hypertension - empagliflozin (JARDIANCE) 10 mg tab; Take 1 Tablet by mouth once daily  Dispense: 90 Tablet; Refill: 0  5. Combined arterial insufficiency and corporo-venous occlusive erectile dysfunction - sildenafiL (VIAGRA) 25 mg tablet; Take 1 Tablet by mouth once daily as needed for erectile dysfunction One hour before sexual activity  Dispense: 30 Tablet; Refill: 2   A1c has improved to 8.2.  Patient is not monitoring blood sugars at home.  He does follow diabetic  diet.  He reports compliance with metformin  500 twice daily and NovoLog  20 units twice daily.  He denies adverse effects to medicine however reports previous adverse effects to metformin  -stomach upset and suicidal thoughts (no longer present)  Discussed diabetes with patient and the function of insulin .  Discussed risk of insulin  in regards to hypo and hyperglycemia.  Patient has never been prescribed other oral medication.  Discussed how oral medication can be more stable for type II diabetics  At this time we will submit for Jardiance 10 mg.  Discussed with patient goal of weaning insulin  should A1c significantly improve.  Discussed possible adverse effects of Jardiance.  Will follow-up 1 month to monitor  Screening labs obtained  Will switch to viagra

## 2023-10-05 NOTE — Unmapped External Note (Signed)
 [id number]   Empagliflozin   Brand Name(s): Jardiance, Glyxambi (as a combination product containing Empagliflozin and Linagliptin), Synjardy (as a combination product containing Empagliflozin and Metformin), Trijardy (as a combination product containing Empagliflozin, Linagliptin, and Metformin)     WHY is this medicine prescribed?   Empagliflozin is used along with diet and exercise, and sometimes with other medications, to lower blood sugar levels in adults and children 48 years of age and older with type 2 diabetes (condition in which blood sugar is too high because the body does not produce or use insulin  normally). Empagliflozin is also used to reduce the risk of stroke, heart attack, or death in people who have type 2 diabetes along with heart and blood vessel disease. Empagliflozin is also used in adults with heart failure to reduce the risk of needing to be hospitalized and death due to heart and blood vessel disease. It is also used to reduce the risk of worsening kidney disease, the need to be hospitalized, and the risk of death due to heart disease in adults with kidney disease. Empagliflozin is in a class of medications called sodium-glucose co-transporter 2 (SGLT2) inhibitors. It lowers blood sugar by causing the kidneys to get rid of more glucose in the urine. Empagliflozin is not used to treat type 1 diabetes (condition in which the body does not produce insulin  and, therefore, cannot control the amount of sugar in the blood) or diabetic ketoacidosis (a serious condition that may develop if high blood sugar is not treated).   Over time, people who have diabetes and high blood sugar can develop serious or life-threatening complications, including heart disease, stroke, kidney problems, nerve damage, and eye problems. Taking empagliflozin, making lifestyle changes (e.g., diet, exercise, quitting smoking), and regularly checking your blood sugar may help to manage your diabetes and improve your  health. This therapy may also decrease your chances of having a heart attack, stroke, or other diabetes-related complications such as kidney failure, nerve damage (numb, cold legs or feet; decreased sexual ability in men and women), eye problems, including changes or loss of vision, or gum disease. Your doctor and other healthcare providers will talk to you about the best way to manage your diabetes.   HOW should this medicine be used?   Empagliflozin comes as a tablet to take by mouth. It is usually taken once a day in the morning with or without food. Take empagliflozin at around the same time every day. Follow the directions on your prescription label carefully, and ask your doctor or pharmacist to explain any part you do not understand. Take empagliflozin exactly as directed. Do not take more or less of it or take it more often than prescribed by your doctor.   Your doctor may start you on a low dose of empagliflozin and increase your dose as needed.   Empagliflozin helps to control your condition, but does not cure it. Continue to take empagliflozin even if you feel well. Do not stop taking empagliflozin without talking to your doctor.   Ask your pharmacist or doctor for a copy of the manufacturer's information for the patient.   Are there OTHER USES for this medicine?   This medication may be prescribed for other uses; ask your doctor or pharmacist for more information.   What SPECIAL PRECAUTIONS should I follow?   Before taking empagliflozin,   tell your doctor and pharmacist if you are allergic to empagliflozin, any other medications, or any of the ingredients in empagliflozin tablets. Ask your  pharmacist for a list of the ingredients.   tell your doctor and pharmacist what other prescription and nonprescription medications, vitamins, nutritional supplements, and herbal products you are taking or plan to take. Your doctor may need to change the doses of your medications or monitor you carefully for side  effects.   tell your doctor if you regularly drink alcohol  or sometimes drink large amounts of alcohol  in a short time (binge drinking), have ever had an amputation, are on a low sodium diet, or if you have an infection. Also, tell your doctor if you have or have ever had urinary tract infections or problems with urination, low blood pressure, high cholesterol, pancreatic disease including pancreatitis (swelling of the pancreas) or have had surgery on your pancreas, yeast infections in the genital area, peripheral vascular disease (narrowing of blood vessels in feet, legs, or arms causing numbness, pain, or coldness in that part of the body), neuropathy (nerve damage that causes tingling, numbness, and pain, usually in your hands and feet), foot ulcers or sores, or kidney or liver disease. If you are male, tell your doctor if you have never been circumcised. Tell your doctor if you are eating or drinking less due to illness, surgery, or a change in your diet; if you are following a ketogenic diet (a high fat, low carbohydrate diet); or if you have recently had diarrhea, vomiting, been in the sun too long, or have been sweating a lot, which may cause dehydration (loss of a large amount of body fluids).   tell your doctor if you are pregnant, plan to become pregnant, or are breastfeeding. Do not breastfeed while you are taking empagliflozin. If you become pregnant while taking empagliflozin, call your doctor.   if you are having surgery, including dental surgery, tell the doctor or dentist that you are taking empagliflozin. Your doctor will probably tell you to stop taking empagliflozin at least 3 days before a surgery.   alcohol  may cause a change in blood sugar. Ask your doctor about the safe use of alcoholic beverages while you are taking empagliflozin.   you should know that empagliflozin may cause dizziness, lightheadedness, and fainting when you get up too quickly from a lying position. If you have this problem,  call your doctor. This problem is more common when you first start taking empagliflozin. To avoid this problem, get out of bed slowly, resting your feet on the floor for a few minutes before standing up.   ask your doctor what to do if you get sick, develop an infection or fever, experience unusual stress, or are injured. These conditions can affect your blood sugar and the amount of empagliflozin you may need.   What SPECIAL DIETARY instructions should I follow?   Be sure to follow all exercise and dietary recommendations made by your doctor or dietitian. It is important to eat a healthful diet and exercise regularly. Follow your doctor's instructions about drinking enough fluids throughout the day while you are on this medication.   What should I do IF I FORGET to take a dose?   Take the missed dose as soon as you remember it. However, if it is almost time for the next dose, skip the missed dose and continue your regular dosing schedule. Do not take a double dose to make up for a missed one.   What SIDE EFFECTS can this medicine cause?   Some side effects can be serious. If you experience any of these symptoms, call your doctor immediately:  frequent, urgent, burning, or painful urination   urine that is cloudy, red, pink, or brown   pelvic or back pain, fever, nausea, or vomiting   (in women) vaginal odor, white or yellowish vaginal discharge (may be lumpy or look like cottage cheese), or vaginal itching   (in men) redness, itching, or swelling of the penis; rash on the penis; foul smelling discharge from the penis; or pain in the skin around the penis   feeling tired, weak, or uncomfortable; along with a fever and pain, tenderness, redness, and swelling of the genitals or the area between the genitals and the rectum   If you experience any of the following symptoms, stop taking empagliflozin and call your doctor immediately or get emergency medical treatment:   rash   hives   itching   difficulty swallowing or  breathing   swelling of the face, throat, tongue, lips, mouth, or eyes   hoarseness   If you experience any of the following symptoms of ketoacidosis, stop taking empagliflozin and call your doctor immediately or get emergency medical treatment. If possible, check for ketones in your urine if you have these symptoms, even if your blood sugar is less than 250 mg/dL:   nausea   vomiting   stomach-area pain   tiredness   difficulty breathing   You should know that empagliflozin can increase the risk of having a lower limb (toe, foot, or leg) amputation. Your doctor will tell you how to take care of your legs and feet properly to help avoid infections and complications that could lead to an amputation. Follow the doctor's instructions carefully and call your doctor right away if you have any pain, tenderness, sores, ulcers, or swollen, warm, reddened area in your leg or foot, fever or chills, or other signs and symptoms of infection.   Empagliflozin can cause dehydration. It is important that you drink plenty of water while taking empagliflozin. Talk to your doctor or pharmacist about the right amount of water to drink to prevent dehydration while taking empagliflozin.   Empagliflozin may cause other side effects. Call your doctor if you have any unusual problems while taking this medication.   If you experience a serious side effect, you or your doctor may send a report to the Food and Drug Administration's (FDA) MedWatch Adverse Event Reporting program online (https://peters-thompson.com/) or by phone [phone]) [phone]).   What should I know about STORAGE and DISPOSAL of this medication?   Keep this medication in the container it came in, tightly closed, and out of reach of children. Store it at room temperature and away from excess heat and moisture (not in the bathroom).   It is important to keep all medication out of sight and reach of children as many containers (such as weekly pill minders and those for eye  drops, creams, patches, and inhalers) are not child-resistant and young children can open them easily. To protect young children from poisoning, always lock safety caps and immediately place the medication in a safe location - one that is up and away and out of their sight and reach. https://www.upandaway.org   Unneeded medications should be disposed of in special ways to ensure that pets, children, and other people cannot consume them. However, you should not flush this medication down the toilet. Instead, the best way to dispose of your medication is through a medicine take-back program. Talk to your pharmacist or contact your local garbage/recycling department to learn about take-back programs in your community. See the FDA's  Safe Disposal of Medicines website (https://goo.gl/c4Rm4p) for more information if you do not have access to a take-back program.   What should I do in case of OVERDOSE?   In case of overdose, call the poison control helpline at ([phone]. Information is also available online at DonorPros.fi. If the victim has collapsed, had a seizure, has trouble breathing, or can't be awakened, immediately call emergency services at 911.   What OTHER INFORMATION should I know?   Keep all appointments with your doctor and the laboratory. Your doctor will probably order certain laboratory tests before and during your treatment to check your body's response to empagliflozin.   Your blood sugar levels should be checked regularly to determine your response to empagliflozin. Your doctor will order other lab tests, including glycosylated hemoglobin (HbA1c), to check your response to empagliflozin. Your doctor will also tell you how to check your response to this medication by measuring your blood sugar levels at home. Follow these instructions carefully.   Before having any laboratory test, tell your doctor and the laboratory personnel that you are taking empagliflozin. Because of the way  this medication works, your urine may test positive for glucose.   You should always wear a diabetic identification bracelet to be sure you get proper treatment in an emergency.   Do not let anyone else take your medication. Ask your pharmacist any questions you have about refilling your prescription.   It is important for you to keep a written list of all of the prescription and nonprescription (over-the-counter) medicines you are taking, as well as any products such as vitamins, minerals, or other dietary supplements. You should bring this list with you each time you visit a doctor or if you are admitted to a hospital. It is also important information to carry with you in case of emergencies.   This report on medications is for your information only, and is not considered individual patient advice. Because of the changing nature of drug information, please consult your physician or pharmacist about specific clinical use.   The [employer] represents that the information provided hereunder was formulated with a reasonable standard of care, and in conformity with professional standards in the field. The American [employer] makes no representations or warranties, express or implied, including, but not limited to, any implied warranty of merchantability and/or fitness for a particular purpose, with respect to such information and specifically disclaims all such warranties. Users are advised that decisions regarding drug therapy are complex medical decisions requiring the independent, informed decision of an appropriate health care professional, and the information is provided for informational purposes only. The entire monograph for a drug should be reviewed for a thorough understanding of the drug's actions, uses and side effects. The [employer] does not endorse or recommend the use of any drug. The  information is not a substitute for medical care.   AHFS Patient Medication InformationT.  Copyright, 2024. [employer], [address], [place], [place] . All Rights Reserved. Duplication for commercial use must be authorized by ASHP.   Selected Revisions: August 25, 2022.   AHFS Patient Medication InformationT.  Copyright, 2025

## 2023-10-09 NOTE — Result Encounter Note (Signed)
 Please let the pt know his labs have returned. Please remind him he can see all of his results if he signs up for MyChart  cholesterol is slightly elevated - total 202 (goal is under 200) and LDL (bad cholesterol) 126 (normal is under 100 and goal is under 70).  It is important he  limit fried/greasy/fatty foods, red meats, and cheeses to help bring his cholesterol down.  At this time I am also increasing the dose of his atorvastatin to 80mg  to help cholesterol reach goal.  This is very important with his diabetes to decrease risk of heart attack and stroke

## 2023-10-10 NOTE — Result Encounter Note (Signed)
 [facility] 10-10-2023 @ 0952-Called and informed pt of lab results, Atorvastatin dose increase to 80mg , and provider's recommendations. Pt verbalized understanding.

## 2024-01-04 NOTE — Progress Notes (Signed)
 Subjective   Christopher Mercado is a 48 year old male here for Diabetes Mellitus [name] is here for diabetic follow-up. He states he does not need refills and has no concerns. ) . He works in a production assistant, radio outside. He doe not check blood sugars due to misinformation about A1c test. He reports taking his statin. He is on a BID regimen of 70/30 insulin  22 units BIS.  He c/I some LE neuropathic discomfort. He has significant dietary indiscretion.   Documentation Reviewed by Any User: Tobacco  Allergies  Med Hx  Surg  Hx  Fam Hx  Soc Hx    Current Outpatient Medications  Medication Sig Dispense Refill  . metFORMIN  (GLUCOPHAGE ) 1,000 mg tablet Take 1 Tablet by mouth 2 (two) times daily with a meal. 180 Tablet 1  . NOVOLOG  MIX 70-30FLEXPEN U-100 100 unit/mL (70-30) injection INJECT 15 UNITS UNDER THE SKIN TWICE A DAY BEFORE A MEAL 27 mL 0  . atorvastatin (LIPITOR) 80 mg tablet Take 1 Tablet by mouth nightly at bedtime 90 Tablet 1  . aspirin  81 mg DR tablet Take 1 Tablet by mouth once daily for 360 days 90 Tablet 3  . empagliflozin (JARDIANCE) 10 mg tab Take 1 Tablet by mouth once daily 90 Tablet 0  . gabapentin  (NEURONTIN ) 300 mg capsule Take 1 Capsule by mouth every morning AND 2 Capsules every evening. (Patient taking differently: Take 2 Capsule by mouth every morning AND 2 Capsules every evening.) 270 Capsule 1  . lisinopriL  2.5 mg tablet Take 1 Tablet by mouth once daily for 180 days 90 Tablet 1  . sildenafiL (VIAGRA) 25 mg tablet Take 1 Tablet by mouth once daily as needed for erectile dysfunction One hour before sexual activity 30 Tablet 2  . pen needle, diabetic (DROPLET PEN NEEDLE) 32 gauge x 5/32 ndle USE DAILY WITH INSULIN  AS DIRECTED 100 Each 11  . diphenhydrAMINE (BENADRYL) 25 mg capsule Take 25 mg by mouth every 4 (four) hours as needed for allergies      Objective   BP 136/82  Pulse 80  Temp 97.2 F (36.2 C)  Ht 5' 7 (1.702 m)  Wt 184 lb (83.5 kg)  SpO2 97%  BMI 28.82 kg/m   Smoking Status Every Day  BSA 1.99 m  Physical Exam HEENT ATNC Well appearing bearded man Lungs clear Cor RRR Abd soft Ext No CCE Feet w/o sores or ulcer, mild decreased sensation to touch Toes intact.  Assessment and Plan   1. Type 2 diabetes mellitus with hyperglycemia, with long-term current use of insulin  (CMS & HHS-HCC) - A1C, ALERE AFINION (POCT) Routine - metFORMIN  (GLUCOPHAGE ) 1,000 mg tablet; Take 1 Tablet by mouth 2 (two) times daily with a meal.  Dispense: 180 Tablet; Refill: 1  2. Type 2 diabetes mellitus with diabetic polyneuropathy, with long-term current use of insulin  (CMS & HHS-HCC) Comments: Uncontrolled. Not monitoring blood sugar due to comments from a prior provider. Will check on continuous monitor for him.  3. Screening for colorectal cancer - Cologuard colon cancer screening Stool Stool Routine   Will plan get Colonoscopy scheduled at age 34. There is no FH of colon cancer. Will try Dexcom G7 to assist with glucose management.  He was advised about diet.

## 2024-06-18 ENCOUNTER — Other Ambulatory Visit: Payer: Self-pay

## 2024-06-18 ENCOUNTER — Encounter (HOSPITAL_COMMUNITY): Payer: Self-pay

## 2024-06-18 ENCOUNTER — Emergency Department (HOSPITAL_COMMUNITY): Payer: Self-pay

## 2024-06-18 ENCOUNTER — Emergency Department (HOSPITAL_COMMUNITY)
Admission: EM | Admit: 2024-06-18 | Discharge: 2024-06-18 | Disposition: A | Discharge status: Home / Self Care | Payer: Self-pay | Attending: Emergency Medicine | Admitting: Emergency Medicine

## 2024-06-18 DIAGNOSIS — R739 Hyperglycemia, unspecified: Secondary | ICD-10-CM

## 2024-06-18 DIAGNOSIS — R079 Chest pain, unspecified: Secondary | ICD-10-CM

## 2024-06-18 DIAGNOSIS — E1140 Type 2 diabetes mellitus with diabetic neuropathy, unspecified: Secondary | ICD-10-CM

## 2024-06-18 LAB — CBC
HCT: 47 % (ref 39.0–52.0)
Hemoglobin: 16.9 g/dL (ref 13.0–17.0)
MCH: 30.7 pg (ref 26.0–34.0)
MCHC: 36 g/dL (ref 30.0–36.0)
MCV: 85.3 fL (ref 80.0–100.0)
Platelets: 310 K/uL (ref 150–400)
RBC: 5.51 MIL/uL (ref 4.22–5.81)
RDW: 11.3 % — ABNORMAL LOW (ref 11.5–15.5)
WBC: 12 K/uL — ABNORMAL HIGH (ref 4.0–10.5)
nRBC: 0 % (ref 0.0–0.2)

## 2024-06-18 LAB — URINALYSIS, ROUTINE W REFLEX MICROSCOPIC
Bacteria, UA: NONE SEEN
Bilirubin Urine: NEGATIVE
Glucose, UA: >=500 mg/dL — AB
Hgb urine dipstick: NEGATIVE
Ketones, ur: 5 mg/dL — AB
Leukocytes,Ua: NEGATIVE
Nitrite: NEGATIVE
Protein, ur: NEGATIVE mg/dL
Specific Gravity, Urine: 1.031 — ABNORMAL HIGH (ref 1.005–1.030)
pH: 6 (ref 5.0–8.0)

## 2024-06-18 LAB — BASIC METABOLIC PANEL WITH GFR
Anion gap: 22 — ABNORMAL HIGH (ref 5–15)
Anion gap: 15 (ref 5–15)
BUN: 15 mg/dL (ref 6–20)
BUN: 15 mg/dL (ref 6–20)
CO2: 18 mmol/L — ABNORMAL LOW (ref 22–32)
CO2: 21 mmol/L — ABNORMAL LOW (ref 22–32)
Calcium: 9.8 mg/dL (ref 8.9–10.3)
Calcium: 9.4 mg/dL (ref 8.9–10.3)
Chloride: 92 mmol/L — ABNORMAL LOW (ref 98–111)
Chloride: 97 mmol/L — ABNORMAL LOW (ref 98–111)
Creatinine, Ser: 0.87 mg/dL (ref 0.61–1.24)
Creatinine, Ser: 0.65 mg/dL (ref 0.61–1.24)
GFR, Estimated: >60 mL/min (ref >60)
GFR, Estimated: >60 mL/min (ref >60)
Glucose, Bld: 470 mg/dL — ABNORMAL HIGH (ref 70–99)
Glucose, Bld: 434 mg/dL — ABNORMAL HIGH (ref 70–99)
Potassium: 4.5 mmol/L (ref 3.5–5.1)
Potassium: 4.4 mmol/L (ref 3.5–5.1)
Sodium: 131 mmol/L — ABNORMAL LOW (ref 135–145)
Sodium: 133 mmol/L — ABNORMAL LOW (ref 135–145)

## 2024-06-18 LAB — BLOOD GAS, VENOUS
Acid-Base Excess: 2.1 mmol/L — ABNORMAL HIGH (ref 0.0–2.0)
Bicarbonate: 25.5 mmol/L (ref 20.0–28.0)
Drawn by: 4237
O2 Saturation: 95.5 %
Patient temperature: 37.1
pCO2, Ven: 35 mmHg — ABNORMAL LOW (ref 44–60)
pH, Ven: 7.47 — ABNORMAL HIGH (ref 7.25–7.43)
pO2, Ven: 64 mmHg — ABNORMAL HIGH (ref 32–45)

## 2024-06-18 LAB — URINE DRUG SCREEN
Amphetamines: NEGATIVE
Barbiturates: NEGATIVE
Benzodiazepines: NEGATIVE
Cocaine: NEGATIVE
Fentanyl: NEGATIVE
Methadone Scn, Ur: NEGATIVE
Opiates: POSITIVE — AB
Tetrahydrocannabinol: NEGATIVE

## 2024-06-18 LAB — TROPONIN T, HIGH SENSITIVITY
Troponin T High Sensitivity: 27 ng/L — ABNORMAL HIGH (ref 0–19)
Troponin T High Sensitivity: 26 ng/L — ABNORMAL HIGH (ref 0–19)

## 2024-06-18 LAB — D-DIMER, QUANTITATIVE: D-Dimer, Quant: 0.3 ug{FEU}/mL (ref 0.00–0.50)

## 2024-06-18 LAB — LACTIC ACID, PLASMA
Lactic Acid, Venous: 2.5 mmol/L (ref 0.5–1.9)
Lactic Acid, Venous: 1.6 mmol/L (ref 0.5–1.9)

## 2024-06-18 LAB — CBG MONITORING, ED: Glucose-Capillary: 497 mg/dL — ABNORMAL HIGH (ref 70–99)

## 2024-06-18 LAB — BETA-HYDROXYBUTYRIC ACID: Beta-Hydroxybutyric Acid: 0.45 mmol/L — ABNORMAL HIGH (ref 0.05–0.27)

## 2024-06-18 MED ORDER — METFORMIN HCL 1000 MG PO TABS: 1000.0000 mg | ORAL_TABLET | Freq: Two times a day (BID) | ORAL | 2 refills | Status: AC

## 2024-06-18 MED ORDER — LACTATED RINGERS IV BOLUS
1000.0000 mL | Freq: Once | INTRAVENOUS | Status: AC
  Administered 2024-06-18: 1000 mL via INTRAVENOUS

## 2024-06-18 MED ORDER — ASPIRIN 81 MG PO CHEW
324.0000 mg | CHEWABLE_TABLET | Freq: Once | ORAL | Status: AC
  Administered 2024-06-18: 324 mg via ORAL
  Filled 2024-06-18: qty 4

## 2024-06-18 MED ORDER — MORPHINE SULFATE (PF) 4 MG/ML IV SOLN
4.0000 mg | Freq: Once | INTRAVENOUS | Status: AC
  Administered 2024-06-18: 4 mg via INTRAVENOUS
  Filled 2024-06-18: qty 1

## 2024-06-18 MED ORDER — IBUPROFEN 800 MG PO TABS: 800.0000 mg | ORAL_TABLET | Freq: Three times a day (TID) | ORAL | 0 refills | Status: AC | PRN

## 2024-06-18 NOTE — ED Provider Notes (Signed)
 [place] EMERGENCY DEPARTMENT AT [facility] Provider Note   CSN: [id number] Arrival date & time: 06/18/24  9149     Patient presents with: Chest Pain   Christopher Mercado is a 48 y.o. male.  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  Associate with shortness of breath and diaphoresis.  He rates it as 6 out of 10.  He denies any prior cardiac history.  He has tried nothing for his symptoms.  Pain is worse with movement of left arm.  He is a smoker.  Denies cocaine.   The history is provided by the patient.  Chest Pain Pain location:  L chest Pain quality: aching   Pain radiates to:  Neck and L arm Pain severity:  Moderate Onset quality:  Sudden Duration:  1 hour Timing:  Constant Progression:  Unchanged Chronicity:  New Relieved by:  None tried Worsened by:  Movement Ineffective treatments:  None tried Associated symptoms: diaphoresis and shortness of breath   Associated symptoms: no abdominal pain, no cough, no fever, no nausea and no vomiting   Risk factors: smoking        Prior to Admission medications   Medication Sig Start Date End Date Taking? Authorizing Provider  acetaminophen  (TYLENOL ) 500 MG tablet Take 500-1,000 mg by mouth every 8 (eight) hours as needed for mild pain or headache.     [provider]  aspirin  EC 81 MG tablet Take 1 tablet (81 mg total) by mouth daily. 07/03/19   Theophilus Andrews, Tully GRADE, MD  diphenhydrAMINE (BENADRYL) 25 mg capsule Take 25 mg by mouth every 8 (eight) hours as needed for itching or allergies.    [provider]  gabapentin  (NEURONTIN ) 300 MG capsule Take 4 capsules (1,200 mg total) by mouth at bedtime. 03/26/20 06/24/20  Theophilus Andrews, Tully GRADE, MD  insulin  glargine (LANTUS ) 100 UNIT/ML injection Inject 0.3 mLs (30 Units total) into the skin daily. 04/27/20   Shamleffer, Ibtehal Jaralla, MD  Insulin  Syringe-Needle  U-100 (RELION INSULIN  SYR 0.3CC/30G) 30G X 5/16 0.3 ML MISC 1 Device by Does not apply route 2 (two) times daily with a meal. Patient taking differently: 22 Units by Does not apply route 2 (two) times daily with a meal.  09/19/19   Shamleffer, Donell Cardinal, MD  lisinopril  (ZESTRIL ) 5 MG tablet Take 1 tablet (5 mg total) by mouth daily. 04/05/20   Theophilus Andrews, Tully GRADE, MD  metFORMIN  (GLUCOPHAGE ) 1000 MG tablet Take 1 tablet (1,000 mg total) by mouth 2 (two) times daily with a meal. 12/03/19   Theophilus Andrews, Tully GRADE, MD    Allergies: Coconut fatty acid and Prednisone     Review of Systems  Constitutional:  Positive for diaphoresis. Negative for fever.  Respiratory:  Positive for shortness of breath. Negative for cough.   Cardiovascular:  Positive for chest pain.  Gastrointestinal:  Negative for abdominal pain, nausea and vomiting.    Updated Vital Signs BP 116/80   Pulse 84   Temp 98.8 F (37.1 C) (Axillary)   Resp 17   Ht 5' 6 (1.676 m)   Wt 81 kg   SpO2 95%   BMI 28.82 kg/m   Physical Exam Vitals and nursing note reviewed.  Constitutional:      General: He is in acute distress.     Appearance: He is well-developed.  HENT:     Head: Normocephalic and atraumatic.  Eyes:     Conjunctiva/sclera: Conjunctivae normal.  Cardiovascular:     Rate and Rhythm: Normal rate and regular rhythm.     Heart sounds: Normal heart sounds. No murmur heard. Pulmonary:     Effort: Pulmonary effort is normal. No respiratory distress.     Breath sounds: Normal breath sounds.  Abdominal:     Palpations: Abdomen is soft.     Tenderness: There is no abdominal tenderness.  Musculoskeletal:        General: No swelling.     Cervical back: Neck supple.     Right lower leg: No tenderness. No edema.     Left lower leg: No tenderness. No edema.  Skin:    General: Skin is warm and dry.     Capillary Refill: Capillary refill takes less than 2 seconds.  Neurological:     General: No focal  deficit present.     Mental Status: He is alert.     (all labs ordered are listed, but only abnormal results are displayed) Labs Reviewed  BASIC METABOLIC PANEL WITH GFR - Abnormal; Notable for the following components:      Result Value   Sodium 131 (*)    Chloride 92 (*)    CO2 18 (*)    Glucose, Bld 470 (*)    Anion gap 22 (*)    All other components within normal limits  CBC - Abnormal; Notable for the following components:   WBC 12.0 (*)    RDW 11.3 (*)    All other components within normal limits  LACTIC ACID, PLASMA - Abnormal; Notable for the following components:   Lactic Acid, Venous 2.5 (*)    All other components within normal limits  BLOOD GAS, VENOUS - Abnormal; Notable for the following components:   pH, Ven 7.47 (*)    pCO2, Ven 35 (*)    pO2, Ven 64 (*)    Acid-Base Excess 2.1 (*)    All other components within normal limits  BETA-HYDROXYBUTYRIC ACID - Abnormal; Notable for the following components:   Beta-Hydroxybutyric Acid 0.45 (*)    All other components within normal limits  URINALYSIS, ROUTINE W REFLEX MICROSCOPIC - Abnormal; Notable for the following components:   Color, Urine STRAW (*)    Specific Gravity, Urine 1.031 (*)    Glucose, UA >=500 (*)    Ketones, ur 5 (*)    All other components within normal limits  URINE DRUG SCREEN - Abnormal; Notable for the following components:   Opiates POSITIVE (*)    All other components within normal limits  BASIC METABOLIC PANEL WITH GFR - Abnormal; Notable for the following components:   Sodium 133 (*)    Chloride 97 (*)    CO2 21 (*)    Glucose, Bld 434 (*)    All other components within normal limits  CBG MONITORING, ED - Abnormal; Notable for the following components:   Glucose-Capillary 497 (*)    All other components within normal limits  TROPONIN T, HIGH SENSITIVITY - Abnormal; Notable for the following components:   Troponin T High Sensitivity 27 (*)    All other components within normal limits   TROPONIN T, HIGH SENSITIVITY - Abnormal; Notable for the following components:   Troponin T High Sensitivity 26 (*)    All other components within normal limits  D-DIMER, QUANTITATIVE  LACTIC ACID, PLASMA    EKG: EKG Interpretation Date/Time:  Tuesday June 18 2024 08:55:17 EST Ventricular Rate:  94 PR Interval:  149 QRS Duration:  91 QT Interval:  358 QTC Calculation: 448 R Axis:   100  Text Interpretation: Sinus rhythm Right axis deviation ST elev, probable normal early repol pattern Confirmed by Towana Sharper [id number]) on 06/18/2024 9:00:00 AM  Radiology: [facility] Chest Port 1 View Result Date: 06/18/2024 EXAM: 1 VIEW(S) XRAY OF THE CHEST 06/18/2024 09:06:19 AM COMPARISON: Comparison to 02/28/2012. CLINICAL HISTORY: cp FINDINGS: LUNGS AND PLEURA: No focal pulmonary opacity. No pleural effusion. No pneumothorax. HEART AND MEDIASTINUM: No acute abnormality of the cardiac and mediastinal silhouettes. BONES AND SOFT TISSUES: Interval postoperative changes at the level of the right coracoclavicular ligament. IMPRESSION: 1. No acute cardiopulmonary process. 2. Interval postoperative changes at the right coracoclavicular ligament  . Electronically signed by: Katheleen Faes MD 06/18/2024 09:22 AM EST RP Workstation: [id number]     Procedures   Medications Ordered in the ED  aspirin  chewable tablet 324 mg (324 mg Oral Given 06/18/24 0859)  morphine  (PF) 4 MG/ML injection 4 mg (4 mg Intravenous Given 06/18/24 0859)  lactated ringers  bolus 1,000 mL (0 mLs Intravenous Stopped 06/18/24 1307)    Clinical Course as of 06/18/24 1714  Tue Jun 18, 2024  0920 Chest x-ray interpreted by me as no free air no pneumothorax.  Awaiting radiology reading. [MB]  1019 Patient resting much more comfortably and vitals have been stable.  Will continue to monitor.  Lactate elevated at 2.5 although he was fairly hyperdynamic when he arrived.  Getting IV fluids. [MB]    Clinical Course User Index [MB] Towana Sharper BROCKS, MD                                 Medical Decision Making Amount and/or Complexity of Data Reviewed Labs: ordered. Radiology: ordered.  Risk OTC drugs. Prescription drug management.   This patient complains of pain in his left chest radiating to neck and arm; this involves an extensive number of treatment Options and is a complaint that carries with it a high risk of complications and morbidity. The differential includes ACS, pneumonia, pneumothorax, PE, vascular  I ordered, reviewed and interpreted labs, which included CBC with elevated white count, chemistries with low bicarb elevated glucose, lactate elevated, troponins mildly elevated but flat, VBG without acidosis, urinalysis without signs of infection or significant ketones, beta hydroxybutyrate mildly elevated, D-dimer flat I ordered medication IV pain medicine, aspirin , fluids and reviewed PMP when indicated. I ordered imaging studies which included chest x-ray and I independently    visualized and interpreted imaging which showed no acute findings Additional history obtained from patient's family members Previous records obtained and reviewed in epic no recent admissions Cardiac monitoring reviewed, sinus rhythm Social determinants considered, tobacco use Critical Interventions: None  After the interventions stated above, I reevaluated the patient and found patient's symptoms to be improving and vital stabilizing Admission and further testing considered, do not think he needs further imaging at this point.  He needs to restart his diabetic medication.  Also given information on establishing care with a primary care doctor.  Return instructions discussed.      Final diagnoses:  Nonspecific chest pain  Hyperglycemia    ED Discharge Orders          Ordered    metFORMIN  (GLUCOPHAGE ) 1000 MG tablet  2 times daily with meals        06/18/24 1255    ibuprofen  (ADVIL ) 800 MG tablet  Every 8 hours  PRN         06/18/24 1255               Towana Ozell BROCKS, MD 06/18/24 [phone]

## 2024-06-18 NOTE — Discharge Instructions (Addendum)
 Providers Accepting New Patients in [place], [place]    [facility] [address]  [place], [place] [address]J [phone] Accepts most insurances  [facility] [address] [place], [place] [address] [phone] Accepts most insurances  [facility] [address] [place], [place] [address]  [phone] Must meet requirements  [facility] [address] [place], [place] [address] [phone] Accepts most insurances  [facility] [address]  [place], [place] [address] [phone] Accepts most insurances  [facility] [address]   [place], [place]   [phone] Accepts most insurances  [facility])  [phone] [address]  [place], [place] [address] [phone] Accepts most insurances     [facility] [address]  [place], [place] [address] [phone] Accepts most insurances  [facility] [address] [place], [place] [address] [phone] option 1 Accepts Medicaid and [facility] [address]  [place], [place] [address] [phone] Accepts most insurances  Benita Outhouse, MD [address] [place], [place] [address] [phone] Accepts most insurances  [facility] [address]  [place], [place] [address] [phone] Accepts most insurances  [facility] [phone] [address] [place], [place] [address] [phone] Accepts most insurances  [place] [place] [address] [place], [place] [address] [phone]  Accepts most insurances   You were seen in the emergency department for left-sided chest pain.  Your blood work did not show any evidence of blood clot or heart attack.  Your blood sugar was elevated and will be important to restart your diabetes medication.  You will need to get a primary  care doctor also.  Return to the emergency department if any worsening or concerning symptoms.

## 2024-06-18 NOTE — ED Triage Notes (Signed)
 Pt arrived via POV c/o left chest pain with radiation to his neck and jaw that began while the Pt was working apprx 1 hr PTA. Pt presents SOB and diaphoretic during Triage.  EDP present at bedside during Triage.
